# Patient Record
Sex: Male | Born: 1960
Health system: Southern US, Community
[De-identification: ages and names within clinical notes are randomized; demographics above are authoritative.]

## PROBLEM LIST (undated history)

## (undated) DIAGNOSIS — H9311 Tinnitus, right ear: Secondary | ICD-10-CM

## (undated) DIAGNOSIS — Z789 Other specified health status: Secondary | ICD-10-CM

## (undated) DIAGNOSIS — G43909 Migraine, unspecified, not intractable, without status migrainosus: Secondary | ICD-10-CM

## (undated) DIAGNOSIS — R002 Palpitations: Secondary | ICD-10-CM

## (undated) DIAGNOSIS — B009 Herpesviral infection, unspecified: Secondary | ICD-10-CM

## (undated) DIAGNOSIS — G720 Drug-induced myopathy: Secondary | ICD-10-CM

## (undated) DIAGNOSIS — G2581 Restless legs syndrome: Secondary | ICD-10-CM

## (undated) DIAGNOSIS — I10 Essential (primary) hypertension: Secondary | ICD-10-CM

## (undated) DIAGNOSIS — Z860101 Personal history of adenomatous and serrated colon polyps: Secondary | ICD-10-CM

## (undated) DIAGNOSIS — I2510 Atherosclerotic heart disease of native coronary artery without angina pectoris: Secondary | ICD-10-CM

## (undated) DIAGNOSIS — G479 Sleep disorder, unspecified: Secondary | ICD-10-CM

## (undated) DIAGNOSIS — Z87442 Personal history of urinary calculi: Secondary | ICD-10-CM

## (undated) DIAGNOSIS — E785 Hyperlipidemia, unspecified: Secondary | ICD-10-CM

## (undated) DIAGNOSIS — R079 Chest pain, unspecified: Secondary | ICD-10-CM

## (undated) DIAGNOSIS — G5600 Carpal tunnel syndrome, unspecified upper limb: Secondary | ICD-10-CM

## (undated) DIAGNOSIS — R1310 Dysphagia, unspecified: Secondary | ICD-10-CM

## (undated) DIAGNOSIS — R109 Unspecified abdominal pain: Secondary | ICD-10-CM

## (undated) DIAGNOSIS — M1990 Unspecified osteoarthritis, unspecified site: Secondary | ICD-10-CM

## (undated) DIAGNOSIS — E7800 Pure hypercholesterolemia, unspecified: Secondary | ICD-10-CM

## (undated) DIAGNOSIS — K5730 Diverticulosis of large intestine without perforation or abscess without bleeding: Secondary | ICD-10-CM

## (undated) DIAGNOSIS — K4090 Unilateral inguinal hernia, without obstruction or gangrene, not specified as recurrent: Secondary | ICD-10-CM

## (undated) HISTORY — DX: Restless legs syndrome: G25.81

## (undated) HISTORY — DX: Hemochromatosis, unspecified: E83.119

## (undated) HISTORY — DX: Carpal tunnel syndrome, unspecified upper limb: G56.00

## (undated) HISTORY — PX: LIVER BIOPSY: SHX301

## (undated) HISTORY — DX: Drug-induced myopathy: G72.0

## (undated) HISTORY — DX: Migraine, unspecified, not intractable, without status migrainosus: G43.909

## (undated) HISTORY — DX: Unspecified abdominal pain: R10.9

## (undated) HISTORY — DX: Tinnitus, right ear: H93.11

## (undated) HISTORY — DX: Herpesviral infection, unspecified: B00.9

## (undated) HISTORY — DX: Other specified health status: Z78.9

## (undated) HISTORY — DX: Sleep disorder, unspecified: G47.9

## (undated) HISTORY — DX: Atherosclerotic heart disease of native coronary artery without angina pectoris: I25.10

## (undated) HISTORY — DX: Diverticulosis of large intestine without perforation or abscess without bleeding: K57.30

## (undated) HISTORY — DX: Palpitations: R00.2

## (undated) HISTORY — DX: Personal history of adenomatous and serrated colon polyps: Z86.0101

## (undated) HISTORY — DX: Pure hypercholesterolemia, unspecified: E78.00

## (undated) HISTORY — DX: Dysphagia, unspecified: R13.10

## (undated) HISTORY — PX: COLONOSCOPY: SHX174

## (undated) HISTORY — DX: Chest pain, unspecified: R07.9

---

## 2003-11-11 ENCOUNTER — Encounter: Admission: RE | Admit: 2003-11-11 | Discharge: 2003-11-11 | Payer: Self-pay | Admitting: Family Medicine

## 2004-12-02 ENCOUNTER — Encounter: Admission: RE | Admit: 2004-12-02 | Discharge: 2004-12-02 | Payer: Self-pay | Admitting: Family Medicine

## 2006-07-26 HISTORY — PX: SHOULDER ARTHROSCOPY: SHX128

## 2008-01-30 ENCOUNTER — Encounter: Admission: RE | Admit: 2008-01-30 | Discharge: 2008-01-30 | Payer: Self-pay | Admitting: Family Medicine

## 2008-02-20 ENCOUNTER — Encounter: Admission: RE | Admit: 2008-02-20 | Discharge: 2008-02-20 | Payer: Self-pay | Admitting: Family Medicine

## 2008-11-18 ENCOUNTER — Encounter: Admission: RE | Admit: 2008-11-18 | Discharge: 2008-11-18 | Payer: Self-pay | Admitting: Family Medicine

## 2008-12-25 ENCOUNTER — Encounter: Payer: Self-pay | Admitting: Critical Care Medicine

## 2009-01-21 ENCOUNTER — Ambulatory Visit (HOSPITAL_COMMUNITY): Admission: RE | Admit: 2009-01-21 | Discharge: 2009-01-21 | Payer: Self-pay | Admitting: Cardiology

## 2009-03-07 DIAGNOSIS — E7800 Pure hypercholesterolemia, unspecified: Secondary | ICD-10-CM

## 2009-03-07 DIAGNOSIS — G2581 Restless legs syndrome: Secondary | ICD-10-CM

## 2009-03-07 DIAGNOSIS — I10 Essential (primary) hypertension: Secondary | ICD-10-CM | POA: Insufficient documentation

## 2009-03-07 DIAGNOSIS — B009 Herpesviral infection, unspecified: Secondary | ICD-10-CM | POA: Insufficient documentation

## 2009-03-10 ENCOUNTER — Ambulatory Visit: Payer: Self-pay | Admitting: Critical Care Medicine

## 2009-03-10 DIAGNOSIS — R0609 Other forms of dyspnea: Secondary | ICD-10-CM

## 2009-03-10 DIAGNOSIS — R0989 Other specified symptoms and signs involving the circulatory and respiratory systems: Secondary | ICD-10-CM

## 2011-12-21 ENCOUNTER — Ambulatory Visit
Admission: RE | Admit: 2011-12-21 | Discharge: 2011-12-21 | Disposition: A | Discharge status: Home / Self Care | Payer: BC Managed Care – PPO | Source: Ambulatory Visit | Attending: Family Medicine | Admitting: Family Medicine

## 2011-12-21 ENCOUNTER — Other Ambulatory Visit: Payer: Self-pay | Admitting: Family Medicine

## 2011-12-21 DIAGNOSIS — R51 Headache: Secondary | ICD-10-CM

## 2012-01-04 ENCOUNTER — Telehealth: Payer: Self-pay | Admitting: Hematology & Oncology

## 2012-01-04 NOTE — Telephone Encounter (Signed)
Left pt message to call for appointment °

## 2012-01-06 ENCOUNTER — Telehealth: Payer: Self-pay | Admitting: Hematology & Oncology

## 2012-01-06 NOTE — Telephone Encounter (Signed)
Left pt message to call for appointment. Pt is out of town will call 3rd time after 6-15

## 2012-01-31 ENCOUNTER — Ambulatory Visit (HOSPITAL_BASED_OUTPATIENT_CLINIC_OR_DEPARTMENT_OTHER): Payer: BC Managed Care – PPO | Admitting: Hematology & Oncology

## 2012-01-31 ENCOUNTER — Other Ambulatory Visit (HOSPITAL_BASED_OUTPATIENT_CLINIC_OR_DEPARTMENT_OTHER): Payer: BC Managed Care – PPO | Admitting: Lab

## 2012-01-31 ENCOUNTER — Ambulatory Visit: Payer: BC Managed Care – PPO

## 2012-01-31 DIAGNOSIS — E119 Type 2 diabetes mellitus without complications: Secondary | ICD-10-CM

## 2012-01-31 LAB — CBC WITH DIFFERENTIAL (CANCER CENTER ONLY)
BASO#: 0 10*3/uL (ref 0.0–0.2)
EOS%: 0.8 % (ref 0.0–7.0)
Eosinophils Absolute: 0.1 10*3/uL (ref 0.0–0.5)
HGB: 13.3 g/dL (ref 13.0–17.1)
LYMPH#: 1.4 10*3/uL (ref 0.9–3.3)
LYMPH%: 21 % (ref 14.0–48.0)
MONO%: 10.2 % (ref 0.0–13.0)
RBC: 4.33 10*6/uL (ref 4.20–5.70)

## 2012-01-31 NOTE — Progress Notes (Signed)
This office note has been dictated.

## 2012-01-31 NOTE — Progress Notes (Signed)
CC:   Bryan Lemma. Manus Gunning, M.D.  DIAGNOSIS:  Hemochromatosis.  HISTORY OF PRESENT ILLNESS:  Mr. Wolbert is a real nice 51 year old gentleman.  I actually saw him back in 2005.  At that point in time he was referred because of hemochromatosis.  However, Mr. Kipp never did any followup with this at that time.  He has been seen by Dr. Manus Gunning.  Dr. Manus Gunning has been following his iron studies and ferritin.  Over the past couple years Mr. Ines ferritin has been running fairly stable.  Back in May of 2011 ferritin was 340.  In May of 2012 ferritin was also 340.  However, in June of this year the ferritin went up to 427.  His iron studies recently showed his serum iron to be 278.  Six months ago it was 113.  Mr. Johansson has been keeping in great shape.  Since I last saw him, he has really looked good.  He is working.  He is working out quite a bit. He is on a few new medications since we last saw him.  He is not sure what type of hemochromatosis he has.  He knows that it runs in his family.  His father had it.  I think an uncle also had it. He says he thinks 1 or 2 of his brothers has it.  He has had no problems with fatigue or weakness.  He has had no abdominal pain.  His appetite has been good.  He has had no change in bowel or bladder habits.  He has had no joint aches or pains.  He has had no issues with diabetes.  There is no dysphagia or odynophagia.  He has had no cough.  He has had no rashes.  Again, Dr. Manus Gunning kindly referred him back to the [facility] so that we could get him on a phlebotomy program.  PAST MEDICAL HISTORY: 1. Hypertension. 2. Hyperlipidemia. 3. Restless legs syndrome. 4. Nephrolithiasis. 5. Herpes simplex.  ALLERGIES:  Mirapex.  MEDICATIONS: 1. Lisinopril 20 mg p.o. daily 2. Zocor 40 mg p.o. daily. 3. Acyclovir 200 mg p.o. 5 times a day for outbreak. 4. Ultram 50 mg p.o. b.i.d. p.r.n. headaches.  SOCIAL HISTORY:   Negative for tobacco use.  There is rare alcohol use. He has no occupational exposures.  He just got back from the beach.  He was not generous with wearing the sunscreen.  FAMILY HISTORY:  Remarkable for hemochromatosis in his father and a couple brothers.  REVIEW OF SYSTEMS:  As stated in history of present illness.  No additional findings are noted on a 12 system review.  PHYSICAL EXAMINATION:  General:  This is a well-developed, well- nourished white gentleman in no obvious distress.  He is quite fit and vigorous.  Vital signs:  Temperature of 97.3, pulse 58, respiratory rate 20, blood pressure 113/66.  Weight is 177.  Head and neck:  Shows a normocephalic, atraumatic skull.  There are no ocular or oral lesions. There are no palpable cervical or supraclavicular lymph nodes.  Lungs: Clear to percussion and auscultation bilaterally.  Cardiac:  Regular rate and rhythm with normal S1, S2.  There are no murmurs, rubs or bruits.  Abdomen:  Soft with good bowel sounds.  There is no palpable abdominal mass.  There is no fluid wave.  There is no guarding or rebound tenderness.  There is no palpable hepatosplenomegaly.  Back:  No tenderness over the spine, ribs or hips.  Extremities:  Show  no clubbing, cyanosis or edema.  He has good range motion of his joints. There is no joint swelling.  In particular, I do not see any swelling of the 1st metacarpophalangeal joint.  LABORATORY STUDIES:  White cell count 6.5, hemoglobin 13.3, hematocrit 37.3, platelet count 150.  IMPRESSION:  Mr. Lammert is a nice 51 year old gentleman with hemochromatosis.  I did send off a genetic assay on him.  One would have to think that he is not a homozygous carrier of the disease given that he is really not on a phlebotomy program and yet his ferritin really has not tended to trend upward.  Recently, however, it looks like his ferritin and iron have decided to make a "move up".  I told Mr. Uppal about the  importance of keeping his ferritin below 100.  This is the "state of the art" therapy for hemochromatosis.  As such, we are going to have to get Mr. Dohse on a phlebotomy program to get his ferritin down.  We will have to be flexible and work with Mr. Krempasky schedule.  I think this would make it a lot easier for him.  He does have 3 kids.  He has 2 daughters and a son.  His daughters do not really need to be checked right now.  If they had hemochromatosis, I could not imagine them having iron accumulation because of monthly cycles.  His son, who is 57, might need to be checked at some point in the next few years to see if he does carry the mutation.  If so, then iron studies could be done to assess for his iron retention.  It has been a year since I last saw Mr. Javid.  Again, he is looking real good.  He is definitely staying fit.  He says he made a conscientious effort to change his lifestyle, to exercise more, to eat better and to lose weight.  I will get Mr. Eggenberger on a phlebotomy program.  Will probably get him phlebotomized weekly for about 3 or 4 weeks.  Will then repeat his ferritin and see how that looks.  We will get Mr. Baumgarten back to see [place] probably in about 6 weeks or so, at which point we will be able to reassess his iron stores.  I spent a good hour or so with Mr. Neumeister today.  He obviously is very knowledgeable about the disease.  I did talk to him at length and encouraged him to be proactive with phlebotomies.    ______________________________ Josph Macho, M.D. PRE/MEDQ  D:  01/31/2012  T:  01/31/2012  Job:  [id number]  ADDENDUM:  HOMOZYGOUS for C282Y mutation.

## 2012-02-02 ENCOUNTER — Other Ambulatory Visit: Payer: Self-pay | Admitting: *Deleted

## 2012-02-02 NOTE — Progress Notes (Signed)
Pt called stating he needed to set up a phlebotomy and wanted to know if his labs were back yet. Will ask Raiford Noble to schedule this Friday at 4:00 and next Friday at 3:30. Will schedule further appts at a later date. To have 3 - 4 weekly phlebotomies per Dr Myna Hidalgo. DNA test for hemochromatosis not back as of yet. Explained that it may take up to 2 weeks. Pt verbalized understanding. Orders placed under

## 2012-02-04 ENCOUNTER — Other Ambulatory Visit: Payer: Self-pay

## 2012-02-04 ENCOUNTER — Ambulatory Visit: Payer: BC Managed Care – PPO

## 2012-02-04 NOTE — Progress Notes (Unsigned)
4:51 PM 02/04/2012 Pt rescheduled d/t arriving late. Teola Bradley, Jada Kuhnert [employer]

## 2012-02-07 LAB — COMPREHENSIVE METABOLIC PANEL
ALT: 13 U/L (ref 0–53)
Albumin: 4.1 g/dL (ref 3.5–5.2)
Alkaline Phosphatase: 62 U/L (ref 39–117)
Glucose, Bld: 92 mg/dL (ref 70–99)
Sodium: 139 mEq/L (ref 135–145)
Total Bilirubin: 0.5 mg/dL (ref 0.3–1.2)
Total Protein: 6 g/dL (ref 6.0–8.3)

## 2012-02-07 LAB — IRON AND TIBC
%SAT: 62 % — ABNORMAL HIGH (ref 20–55)
Iron: 141 ug/dL (ref 42–165)

## 2012-02-11 ENCOUNTER — Ambulatory Visit (HOSPITAL_BASED_OUTPATIENT_CLINIC_OR_DEPARTMENT_OTHER): Payer: BC Managed Care – PPO

## 2012-02-11 NOTE — Patient Instructions (Signed)

## 2012-02-18 ENCOUNTER — Ambulatory Visit (HOSPITAL_BASED_OUTPATIENT_CLINIC_OR_DEPARTMENT_OTHER): Payer: BC Managed Care – PPO

## 2012-02-18 NOTE — Progress Notes (Signed)
Bard Herbert presents today for phlebotomy per MD orders. Phlebotomy procedure started at 1115 and ended at 1135. Approximately 500 mls removed. Patient observed for 30 minutes after procedure without any incident. Patient tolerated procedure well. IV needle removed intact.

## 2012-02-18 NOTE — Patient Instructions (Signed)

## 2012-03-01 ENCOUNTER — Ambulatory Visit (HOSPITAL_BASED_OUTPATIENT_CLINIC_OR_DEPARTMENT_OTHER): Payer: BC Managed Care – PPO

## 2012-03-01 VITALS — BP 120/68 | HR 63 | Temp 97.3°F | Resp 16

## 2012-03-01 DIAGNOSIS — R0989 Other specified symptoms and signs involving the circulatory and respiratory systems: Secondary | ICD-10-CM

## 2012-03-01 NOTE — Progress Notes (Signed)
Brent Odom presents today for phlebotomy per MD orders. Phlebotomy procedure started [id number] and ended at 1210. 500 ml removed. Patient observed for 30 minutes after procedure without any incident. Patient tolerated procedure well. IV needle removed intact.

## 2012-03-01 NOTE — Patient Instructions (Signed)

## 2012-12-13 ENCOUNTER — Telehealth: Payer: Self-pay | Admitting: Hematology & Oncology

## 2012-12-13 NOTE — Telephone Encounter (Signed)
Received call from PCP said pt needs to be seen. They will fax current lab and note to show to Dr. Myna Hidalgo. Pt aware of 5-28 appointment

## 2012-12-13 NOTE — Telephone Encounter (Signed)
Dr. Myna Hidalgo has seen lab and note from referring and said 5-28 is fine

## 2012-12-19 ENCOUNTER — Other Ambulatory Visit: Payer: Self-pay | Admitting: Medical

## 2012-12-20 ENCOUNTER — Ambulatory Visit (HOSPITAL_BASED_OUTPATIENT_CLINIC_OR_DEPARTMENT_OTHER): Payer: BC Managed Care – PPO | Admitting: Hematology & Oncology

## 2012-12-20 ENCOUNTER — Other Ambulatory Visit (HOSPITAL_BASED_OUTPATIENT_CLINIC_OR_DEPARTMENT_OTHER): Payer: BC Managed Care – PPO | Admitting: Lab

## 2012-12-20 LAB — CBC WITH DIFFERENTIAL (CANCER CENTER ONLY)
BASO%: 0.2 % (ref 0.0–2.0)
Eosinophils Absolute: 0.1 10*3/uL (ref 0.0–0.5)
LYMPH%: 14 % (ref 14.0–48.0)
MCH: 30.9 pg (ref 28.0–33.4)
MCV: 89 fL (ref 82–98)
MONO%: 8.1 % (ref 0.0–13.0)
Platelets: 163 10*3/uL (ref 145–400)
RDW: 12.6 % (ref 11.1–15.7)

## 2012-12-20 LAB — COMPREHENSIVE METABOLIC PANEL
Alkaline Phosphatase: 59 U/L (ref 39–117)
Glucose, Bld: 123 mg/dL — ABNORMAL HIGH (ref 70–99)
Sodium: 139 mEq/L (ref 135–145)
Total Bilirubin: 0.5 mg/dL (ref 0.3–1.2)
Total Protein: 6.5 g/dL (ref 6.0–8.3)

## 2012-12-20 LAB — IRON AND TIBC
TIBC: 254 ug/dL (ref 215–435)
UIBC: 108 ug/dL — ABNORMAL LOW (ref 125–400)

## 2012-12-20 NOTE — Progress Notes (Signed)
This office note has been dictated.

## 2012-12-21 NOTE — Progress Notes (Signed)
CC:   Brent Odom. Brent Odom, M.D.  DIAGNOSIS:  Hemochromatosis (C282Y mutation-homozygous).  CURRENT THERAPY:  Phlebotomy to maintain ferritin less than 100.  INTERIM HISTORY:  Brent Odom comes in for follow-up.  We last saw him back in July.  When we first saw him, his ferritin was 528.  He was phlebotomized 3 times.  Unfortunately, he could not come back for further follow-up.  He has  changed jobs.  He is now with a new job.  His daughter is getting married in July.  He is getting ready for this.  He sees Dr. Manus Odom.  Dr. Manus Odom did some blood work on him.  Dr. Manus Odom found that his ferritin was over 300.  As such, Brent Odom is back here for follow-up.  Brent Odom feels well.  He does feel a little bit fatigued.  He did have some "colds" over the wintertime.  He did take vitamin C for this. He knows that vitamin C will increase his iron absorption.  He has had no abdominal pain.  There has been no change in bowel or bladder habits.  He has had no cough or shortness of breath.  There has been no leg swelling.  PHYSICAL EXAMINATION:  General:  This is a well-developed, well- nourished white gentleman in no obvious distress.  Vital signs: Temperature 98.0, pulse 70, respiratory rate 18, blood pressure 112/63, weight 176.  Head/Neck:  Normocephalic, atraumatic skull.  There are no ocular or oral lesions.  There are no palpable cervical or supraclavicular lymph nodes.  Lungs:  Clear bilaterally.  Cardiac: Regular rate and rhythm with a normal S1 and S2.  There are no murmurs, rubs or bruits.  Abdomen:  Soft with good bowel sounds.  There is no palpable abdominal mass.  There is no fluid wave.  There is no palpable hepatosplenomegaly.  Extremities:  Show no clubbing, cyanosis or edema. Neurologic:  Shows no focal neurological deficits.  Skin:  No rashes, ecchymoses, or petechia.  LABORATORY STUDIES:  White cell count 6.1, hemoglobin 14.2, hematocrit 40.9, platelet count  163.  IMPRESSION/PLAN:  Brent Odom is a 52 year old gentleman with hemochromatosis.  Again, his homozygous is a major mutation.  We will go ahead and try to get him set up with another run of phlebotomies.  He will probably need 3 or 4 more.  Apparently, he is going to try to rearrange his work schedule for [place] and he will let us know we he can be phlebotomized.  I want to see him back in 2 months' time.  We will have to try to be a little bit more vigilant with his iron monitoring.    ______________________________ Josph Macho, M.D. PRE/MEDQ  D:  12/20/2012  T:  12/21/2012  Job:  [id number]

## 2013-01-23 ENCOUNTER — Ambulatory Visit (HOSPITAL_BASED_OUTPATIENT_CLINIC_OR_DEPARTMENT_OTHER): Payer: BC Managed Care – PPO

## 2013-01-23 NOTE — Patient Instructions (Addendum)

## 2013-01-23 NOTE — Progress Notes (Signed)
Bard Herbert presents today for phlebotomy per MD orders. Phlebotomy procedure started at 0930and ended at 0935. Approximately 500 mls removed. Patient observed for 30 minutes after procedure without any incident. Patient tolerated procedure well. IV needle removed intact.

## 2013-02-21 ENCOUNTER — Other Ambulatory Visit (HOSPITAL_BASED_OUTPATIENT_CLINIC_OR_DEPARTMENT_OTHER): Payer: BC Managed Care – PPO | Admitting: Lab

## 2013-02-21 ENCOUNTER — Ambulatory Visit: Payer: BC Managed Care – PPO

## 2013-02-21 ENCOUNTER — Ambulatory Visit (HOSPITAL_BASED_OUTPATIENT_CLINIC_OR_DEPARTMENT_OTHER): Payer: BC Managed Care – PPO | Admitting: Hematology & Oncology

## 2013-02-21 LAB — CBC WITH DIFFERENTIAL (CANCER CENTER ONLY)
BASO#: 0 10*3/uL (ref 0.0–0.2)
Eosinophils Absolute: 0.1 10*3/uL (ref 0.0–0.5)
HCT: 42.3 % (ref 38.7–49.9)
LYMPH%: 21.8 % (ref 14.0–48.0)
MCH: 31.3 pg (ref 28.0–33.4)
MCV: 91 fL (ref 82–98)
MONO%: 8.7 % (ref 0.0–13.0)
NEUT%: 68.1 % (ref 40.0–80.0)
Platelets: 159 10*3/uL (ref 145–400)
RBC: 4.64 10*6/uL (ref 4.20–5.70)

## 2013-02-21 LAB — IRON AND TIBC CHCC
%SAT: 40 % (ref 20–55)
TIBC: 274 ug/dL (ref 202–409)

## 2013-02-21 NOTE — Progress Notes (Signed)
This office note has been dictated.

## 2013-02-21 NOTE — Progress Notes (Signed)
No phlebotomy per dr. ennever 

## 2013-02-22 ENCOUNTER — Telehealth: Payer: Self-pay | Admitting: Hematology & Oncology

## 2013-02-22 NOTE — Telephone Encounter (Signed)
Pt aware of 8-4,12 and 19th phlebotomies and 9-11 MD appointments

## 2013-02-22 NOTE — Progress Notes (Signed)
CC:   Brent Odom. Brent Odom, M.D.  DIAGNOSIS:  Hemochromatosis (C282Y mutation, homozygous).  CURRENT THERAPY:  Phlebotomy to maintain ferritin less than 100.  INTERIM HISTORY:  Brent Odom comes in for a followup.  We last phlebotomized him, I think, back in May.  Since then, he has had some family issues.  Most of this was his daughter's wedding.  She got married back on July 12th.  This was a good time for his family.  He is quite pleased with how well things went.  He is working.  He is doing well.  He is staying busy.  He really has no symptoms.  He is trying to exercise.  He is working out.  He is having no problems with cough or shortness breath.  He has had no fevers, sweats, or chills.  He has had no nausea or vomiting. There has been no change in bowel or bladder habits.  PHYSICAL EXAM:  General:  This is a well-developed, well-nourished white gentleman in no obvious distress.  Vital Signs:  Show a temperature of 97.8, pulse 74, respiratory rate 18, blood pressure 99/62.  Weight is 178.  Head and Neck:  Show a normocephalic, atraumatic skull.  There are no ocular or oral lesions.  There are no palpable cervical or supraclavicular lymph nodes.  Lungs:  Clear bilaterally.  Cardiac: Regular rate and rhythm with a normal S1 and S2.  There are no murmurs, rubs, or bruits.  Abdomen:  Soft.  He has good bowel sounds.  There is no fluid wave.  There is no palpable hepatosplenomegaly.  Extremities: Show no clubbing, cyanosis, or edema.  He has good range motion of his joints.  There is no joint swelling.  Neurological:  Shows no focal neurological deficits.  LABORATORY STUDIES:  White cell count is 6.1.  Hemoglobin of 14.5, hematocrit of 42.3, platelet count 159.  Ferritin is 222.  Iron saturation is 40%.  IMPRESSION:  Brent Odom is a nice 52 year old gentleman with hemochromatosis.  We are going to have to phlebotomize him.  I think he is probably going to need 3 weeks of  phlebotomies.  His iron saturation is starting to come down which is nice to see.  I do want to get his ferritin below 100.  We will try to get him set up with a phlebotomy next week.  I will plan to see him back myself, probably in about 6 weeks or so, at which point we should be seeing a nice decrease in his ferritin and iron saturation.   ______________________________ Josph Macho, M.D. PRE/MEDQ  D:  02/21/2013  T:  02/22/2013  Job:  [id number]

## 2013-02-26 ENCOUNTER — Ambulatory Visit: Payer: BC Managed Care – PPO

## 2013-02-26 NOTE — Progress Notes (Signed)
Bard Herbert presents today for phlebotomy per MD orders. Phlebotomy procedure started at 0835 and ended at 0840 500 grams removed. Patient observed for 30 minutes after procedure without any incident. Patient tolerated procedure well. IV needle removed intact.

## 2013-02-26 NOTE — Patient Instructions (Signed)
Therapeutic Phlebotomy Therapeutic phlebotomy is the controlled removal of blood from your body for the purpose of treating a medical condition. It is similar to donating blood. Usually, about a pint (470 mL) of blood is removed. The average adult has 9 to 12 pints (4.3 to 5.7 L) of blood. Therapeutic phlebotomy may be used to treat the following medical conditions:  Hemochromatosis. This is a condition in which there is too much iron in the blood.  Polycythemia vera. This is a condition in which there are too many red cells in the blood.  Porphyria cutanea tarda. This is a disease usually passed from one generation to the next (inherited). It is a condition in which an important part of hemoglobin is not made properly. This results in the build up of abnormal amounts of porphyrins in the body.  Sickle cell disease. This is an inherited disease. It is a condition in which the red blood cells form an abnormal crescent shape rather than a round shape. LET YOUR CAREGIVER KNOW ABOUT:  Allergies.  Medicines taken including herbs, eyedrops, over-the-counter medicines, and creams.  Use of steroids (by mouth or creams).  Previous problems with anesthetics or numbing medicine.  History of blood clots.  History of bleeding or blood problems.  Previous surgery.  Possibility of pregnancy, if this applies. RISKS AND COMPLICATIONS This is a simple and safe procedure. Problems are unlikely. However, problems can occur and may include:  Nausea or lightheadedness.  Low blood pressure.  Soreness, bleeding, swelling, or bruising at the needle insertion site.  Infection. BEFORE THE PROCEDURE  This is a procedure that can be done as an outpatient. Confirm the time that you need to arrive for your procedure. Confirm whether there is a need to fast or withhold any medications. It is helpful to wear clothing with sleeves that can be raised above the elbow. A blood sample may be done to determine the  amount of red blood cells or iron in your blood. Plan ahead of time to have someone drive you home after the procedure. PROCEDURE The entire procedure from preparation through recovery takes about 1 hour. The actual collection takes about 10 to 15 minutes.  A needle will be inserted into your vein.  Tubing and a collection bag will be attached to that needle.  Blood will flow through the needle and tubing into the collection bag.  You may be asked to open and close your hand slowly and continuously during the entire collection.  Once the specified amount of blood has been removed from your body, the collection bag and tubing will be clamped.  The needle will be removed.  Pressure will be held on the site of the needle insertion to stop the bleeding. Then a bandage will be placed over the needle insertion site. AFTER THE PROCEDURE  Your recovery will be assessed and monitored. If there are no problems, as an outpatient, you should be able to go home shortly after the procedure.  Document Released: 12/14/2010 Document Revised: 10/04/2011 Document Reviewed: 12/14/2010 Exit[employer] Patient Information 2014 [employer].  

## 2013-03-06 ENCOUNTER — Ambulatory Visit (HOSPITAL_BASED_OUTPATIENT_CLINIC_OR_DEPARTMENT_OTHER): Payer: BC Managed Care – PPO

## 2013-03-06 NOTE — Progress Notes (Signed)
Brent Odom presents today for phlebotomy per MD orders. Phlebotomy procedure started at 0850 and ended at 0856. 500 grams removed. Patient observed for 30 minutes after procedure without any incident. Patient tolerated procedure well. IV needle removed intact.

## 2013-03-06 NOTE — Patient Instructions (Signed)
Therapeutic Phlebotomy Therapeutic phlebotomy is the controlled removal of blood from your body for the purpose of treating a medical condition. It is similar to donating blood. Usually, about a pint (470 mL) of blood is removed. The average adult has 9 to 12 pints (4.3 to 5.7 L) of blood. Therapeutic phlebotomy may be used to treat the following medical conditions:  Hemochromatosis. This is a condition in which there is too much iron in the blood.  Polycythemia vera. This is a condition in which there are too many red cells in the blood.  Porphyria cutanea tarda. This is a disease usually passed from one generation to the next (inherited). It is a condition in which an important part of hemoglobin is not made properly. This results in the build up of abnormal amounts of porphyrins in the body.  Sickle cell disease. This is an inherited disease. It is a condition in which the red blood cells form an abnormal crescent shape rather than a round shape. LET YOUR CAREGIVER KNOW ABOUT:  Allergies.  Medicines taken including herbs, eyedrops, over-the-counter medicines, and creams.  Use of steroids (by mouth or creams).  Previous problems with anesthetics or numbing medicine.  History of blood clots.  History of bleeding or blood problems.  Previous surgery.  Possibility of pregnancy, if this applies. RISKS AND COMPLICATIONS This is a simple and safe procedure. Problems are unlikely. However, problems can occur and may include:  Nausea or lightheadedness.  Low blood pressure.  Soreness, bleeding, swelling, or bruising at the needle insertion site.  Infection. BEFORE THE PROCEDURE  This is a procedure that can be done as an outpatient. Confirm the time that you need to arrive for your procedure. Confirm whether there is a need to fast or withhold any medications. It is helpful to wear clothing with sleeves that can be raised above the elbow. A blood sample may be done to determine the  amount of red blood cells or iron in your blood. Plan ahead of time to have someone drive you home after the procedure. PROCEDURE The entire procedure from preparation through recovery takes about 1 hour. The actual collection takes about 10 to 15 minutes.  A needle will be inserted into your vein.  Tubing and a collection bag will be attached to that needle.  Blood will flow through the needle and tubing into the collection bag.  You may be asked to open and close your hand slowly and continuously during the entire collection.  Once the specified amount of blood has been removed from your body, the collection bag and tubing will be clamped.  The needle will be removed.  Pressure will be held on the site of the needle insertion to stop the bleeding. Then a bandage will be placed over the needle insertion site. AFTER THE PROCEDURE  Your recovery will be assessed and monitored. If there are no problems, as an outpatient, you should be able to go home shortly after the procedure.  Document Released: 12/14/2010 Document Revised: 10/04/2011 Document Reviewed: 12/14/2010 Exit[employer] Patient Information 2014 [employer].  

## 2013-03-13 ENCOUNTER — Ambulatory Visit (HOSPITAL_BASED_OUTPATIENT_CLINIC_OR_DEPARTMENT_OTHER): Payer: BC Managed Care – PPO

## 2013-03-13 NOTE — Patient Instructions (Addendum)
Therapeutic Phlebotomy Therapeutic phlebotomy is the controlled removal of blood from your body for the purpose of treating a medical condition. It is similar to donating blood. Usually, about a pint (470 mL) of blood is removed. The average adult has 9 to 12 pints (4.3 to 5.7 L) of blood. Therapeutic phlebotomy may be used to treat the following medical conditions:  Hemochromatosis. This is a condition in which there is too much iron in the blood.  Polycythemia vera. This is a condition in which there are too many red cells in the blood.  Porphyria cutanea tarda. This is a disease usually passed from one generation to the next (inherited). It is a condition in which an important part of hemoglobin is not made properly. This results in the build up of abnormal amounts of porphyrins in the body.  Sickle cell disease. This is an inherited disease. It is a condition in which the red blood cells form an abnormal crescent shape rather than a round shape. LET YOUR CAREGIVER KNOW ABOUT:  Allergies.  Medicines taken including herbs, eyedrops, over-the-counter medicines, and creams.  Use of steroids (by mouth or creams).  Previous problems with anesthetics or numbing medicine.  History of blood clots.  History of bleeding or blood problems.  Previous surgery.  Possibility of pregnancy, if this applies. RISKS AND COMPLICATIONS This is a simple and safe procedure. Problems are unlikely. However, problems can occur and may include:  Nausea or lightheadedness.  Low blood pressure.  Soreness, bleeding, swelling, or bruising at the needle insertion site.  Infection. BEFORE THE PROCEDURE  This is a procedure that can be done as an outpatient. Confirm the time that you need to arrive for your procedure. Confirm whether there is a need to fast or withhold any medications. It is helpful to wear clothing with sleeves that can be raised above the elbow. A blood sample may be done to determine the  amount of red blood cells or iron in your blood. Plan ahead of time to have someone drive you home after the procedure. PROCEDURE The entire procedure from preparation through recovery takes about 1 hour. The actual collection takes about 10 to 15 minutes.  A needle will be inserted into your vein.  Tubing and a collection bag will be attached to that needle.  Blood will flow through the needle and tubing into the collection bag.  You may be asked to open and close your hand slowly and continuously during the entire collection.  Once the specified amount of blood has been removed from your body, the collection bag and tubing will be clamped.  The needle will be removed.  Pressure will be held on the site of the needle insertion to stop the bleeding. Then a bandage will be placed over the needle insertion site. AFTER THE PROCEDURE  Your recovery will be assessed and monitored. If there are no problems, as an outpatient, you should be able to go home shortly after the procedure.  Document Released: 12/14/2010 Document Revised: 10/04/2011 Document Reviewed: 12/14/2010 [employer] Patient Information 2014 [employer].  

## 2013-03-13 NOTE — Progress Notes (Signed)
Brent Odom presents today for phlebotomy per MD orders. Phlebotomy procedure started at 0825 and ended at 0835. 500 grams removed. Patient observed for 30 minutes after procedure without any incident. Patient tolerated procedure well. IV needle removed intact.

## 2013-04-05 ENCOUNTER — Other Ambulatory Visit (HOSPITAL_BASED_OUTPATIENT_CLINIC_OR_DEPARTMENT_OTHER): Payer: BC Managed Care – PPO | Admitting: Lab

## 2013-04-05 ENCOUNTER — Ambulatory Visit (HOSPITAL_BASED_OUTPATIENT_CLINIC_OR_DEPARTMENT_OTHER): Payer: BC Managed Care – PPO | Admitting: Hematology & Oncology

## 2013-04-05 LAB — CBC WITH DIFFERENTIAL (CANCER CENTER ONLY)
Eosinophils Absolute: 0.1 10*3/uL (ref 0.0–0.5)
HCT: 41.3 % (ref 38.7–49.9)
LYMPH#: 1.1 10*3/uL (ref 0.9–3.3)
LYMPH%: 19.9 % (ref 14.0–48.0)
MCV: 93 fL (ref 82–98)
MONO#: 0.5 10*3/uL (ref 0.1–0.9)
Platelets: 163 10*3/uL (ref 145–400)
RBC: 4.43 10*6/uL (ref 4.20–5.70)
WBC: 5.4 10*3/uL (ref 4.0–10.0)

## 2013-04-05 LAB — IRON AND TIBC CHCC
%SAT: 35 % (ref 20–55)
TIBC: 285 ug/dL (ref 202–409)
UIBC: 186 ug/dL (ref 117–376)

## 2013-04-05 LAB — FERRITIN CHCC: Ferritin: 90 ng/ml (ref 22–316)

## 2013-04-05 NOTE — Progress Notes (Signed)
This office note has been dictated.

## 2013-04-06 ENCOUNTER — Telehealth: Payer: Self-pay | Admitting: Hematology & Oncology

## 2013-04-06 NOTE — Progress Notes (Signed)
CC:   Brent Odom. Brent Odom, M.D.  DIAGNOSIS:  Hemochromatosis (C282Y homozygous mutation).  CURRENT THERAPY:  Phlebotomy to maintain ferritin less than 100.  INTERIM HISTORY:  Brent Odom comes in for followup.  He is doing okay. He was phlebotomized back in, I think, July and early August.  He did well with this.  At that time, his ferritin was 222.  He has had no fatigue or weakness.  He has had no fevers, sweats, or chills.  He has had no nausea or vomiting.  He has had no leg rashes. There has been no leg swelling.  There has been no change in bowel or bladder habits.  He has had no headache.  There has been no double vision.  He has had no dysphagia or odynophagia.  PHYSICAL EXAMINATION:  General:  This is a well-developed, well- nourished white gentleman in no obvious distress.  Vital Signs:  Show temperature of 98, pulse 81, respiratory rate 18, blood pressure 119/72. Weight is 183.  Head and Neck:  Shows a normocephalic, atraumatic skull. There are no ocular or oral lesions.  There are no palpable cervical or supraclavicular lymph nodes.  Lungs:  Clear bilaterally.  Cardiac: Regular rate and rhythm with a normal S1 and S2.  There are no murmurs, rubs or bruits.  Abdomen:  Soft.  He has good bowel sounds.  There is no fluid wave.  There is no palpable abdominal mass.  There is no palpable hepatosplenomegaly.  Extremities:  Show no clubbing, cyanosis or edema. He has good range motion of his joints.  There is no joint swelling.  He has good muscle strength.  Neurological:  No focal neurological deficits.  Skin:  No rashes, ecchymosis, or petechia.  LABORATORY STUDIES:  White cell count is 5.4, hemoglobin 14.1, hematocrit 41.3, platelet count 163.  Ferritin is 90 with an iron saturation of 35%.  IMPRESSION:  Brent Odom is a 52 year old gentleman with hemochromatosis.  We now have him below 100 with his ferritin.  I think we can probably get him back now in about 6 weeks.  I  think this would be reasonable for followup with him.  Hopefully, we will continue to see his iron/ferritin drop.  I do not see any complications from the hemochromatosis to date.  His iron saturation is coming down.  Also, this is a good indicator of depleting iron stores.   ______________________________ Josph Macho, M.D. PRE/MEDQ  D:  04/05/2013  T:  04/06/2013  Job:  [id number]

## 2013-04-06 NOTE — Telephone Encounter (Signed)
Pt aware of 1-16 appointment °

## 2013-05-31 ENCOUNTER — Ambulatory Visit: Payer: BC Managed Care – PPO | Admitting: Hematology & Oncology

## 2013-05-31 ENCOUNTER — Other Ambulatory Visit: Payer: BC Managed Care – PPO | Admitting: Lab

## 2013-05-31 ENCOUNTER — Telehealth: Payer: Self-pay | Admitting: Hematology & Oncology

## 2013-05-31 NOTE — Telephone Encounter (Signed)
Patient called and cx 05/31/13 apt and resch for 07/03/13.  Rn was notified of the cx apt

## 2013-07-03 ENCOUNTER — Ambulatory Visit: Payer: BC Managed Care – PPO | Admitting: Hematology & Oncology

## 2013-07-03 ENCOUNTER — Other Ambulatory Visit: Payer: BC Managed Care – PPO | Admitting: Lab

## 2013-07-04 ENCOUNTER — Telehealth: Payer: Self-pay | Admitting: Hematology & Oncology

## 2013-07-04 NOTE — Telephone Encounter (Signed)
Left pt message to call if he wants to r/s missed 12-9 appointment

## 2014-06-09 ENCOUNTER — Encounter: Payer: Self-pay | Admitting: *Deleted

## 2014-12-12 ENCOUNTER — Encounter (HOSPITAL_COMMUNITY): Payer: Self-pay | Admitting: Emergency Medicine

## 2014-12-12 ENCOUNTER — Emergency Department (HOSPITAL_COMMUNITY)
Admission: EM | Admit: 2014-12-12 | Discharge: 2014-12-12 | Disposition: A | Discharge status: Home / Self Care | Payer: Self-pay | Attending: Emergency Medicine | Admitting: Emergency Medicine

## 2014-12-12 ENCOUNTER — Emergency Department (HOSPITAL_COMMUNITY): Payer: Self-pay

## 2014-12-12 DIAGNOSIS — Z79899 Other long term (current) drug therapy: Secondary | ICD-10-CM | POA: Insufficient documentation

## 2014-12-12 DIAGNOSIS — I10 Essential (primary) hypertension: Secondary | ICD-10-CM | POA: Insufficient documentation

## 2014-12-12 DIAGNOSIS — N200 Calculus of kidney: Secondary | ICD-10-CM | POA: Insufficient documentation

## 2014-12-12 DIAGNOSIS — R7989 Other specified abnormal findings of blood chemistry: Secondary | ICD-10-CM | POA: Insufficient documentation

## 2014-12-12 HISTORY — DX: Essential (primary) hypertension: I10

## 2014-12-12 LAB — COMPREHENSIVE METABOLIC PANEL
ALBUMIN: 3.7 g/dL (ref 3.5–5.0)
ALK PHOS: 55 U/L (ref 38–126)
ALT: 17 U/L (ref 17–63)
ANION GAP: 13 (ref 5–15)
AST: 19 U/L (ref 15–41)
BILIRUBIN TOTAL: 1.2 mg/dL (ref 0.3–1.2)
BUN: 16 mg/dL (ref 6–20)
CHLORIDE: 102 mmol/L (ref 101–111)
CO2: 20 mmol/L — AB (ref 22–32)
Calcium: 9.2 mg/dL (ref 8.9–10.3)
Creatinine, Ser: 2.21 mg/dL — ABNORMAL HIGH (ref 0.61–1.24)
GFR calc Af Amer: 37 mL/min — ABNORMAL LOW (ref >60)
GFR, EST NON AFRICAN AMERICAN: 32 mL/min — AB (ref >60)
GLUCOSE: 98 mg/dL (ref 65–99)
POTASSIUM: 3.7 mmol/L (ref 3.5–5.1)
Sodium: 135 mmol/L (ref 135–145)
Total Protein: 6.7 g/dL (ref 6.5–8.1)

## 2014-12-12 LAB — CBC WITH DIFFERENTIAL/PLATELET
BASOS ABS: 0 10*3/uL (ref 0.0–0.1)
BASOS PCT: 0 % (ref 0–1)
EOS ABS: 0.1 10*3/uL (ref 0.0–0.7)
EOS PCT: 0 % (ref 0–5)
HCT: 43 % (ref 39.0–52.0)
Hemoglobin: 15.3 g/dL (ref 13.0–17.0)
Lymphocytes Relative: 10 % — ABNORMAL LOW (ref 12–46)
Lymphs Abs: 1.3 10*3/uL (ref 0.7–4.0)
MCH: 30.2 pg (ref 26.0–34.0)
MCHC: 35.6 g/dL (ref 30.0–36.0)
MCV: 85 fL (ref 78.0–100.0)
Monocytes Absolute: 1.1 10*3/uL — ABNORMAL HIGH (ref 0.1–1.0)
Monocytes Relative: 9 % (ref 3–12)
Neutro Abs: 10.2 10*3/uL — ABNORMAL HIGH (ref 1.7–7.7)
Neutrophils Relative %: 81 % — ABNORMAL HIGH (ref 43–77)
PLATELETS: 170 10*3/uL (ref 150–400)
RBC: 5.06 MIL/uL (ref 4.22–5.81)
RDW: 12.6 % (ref 11.5–15.5)
WBC: 12.7 10*3/uL — ABNORMAL HIGH (ref 4.0–10.5)

## 2014-12-12 LAB — URINALYSIS, ROUTINE W REFLEX MICROSCOPIC
GLUCOSE, UA: NEGATIVE mg/dL
Ketones, ur: >80 mg/dL — AB
Leukocytes, UA: NEGATIVE
Nitrite: NEGATIVE
PROTEIN: 30 mg/dL — AB
SPECIFIC GRAVITY, URINE: 1.021 (ref 1.005–1.030)
UROBILINOGEN UA: 1 mg/dL (ref 0.0–1.0)
pH: 5.5 (ref 5.0–8.0)

## 2014-12-12 LAB — URINE MICROSCOPIC-ADD ON

## 2014-12-12 MED ORDER — TAMSULOSIN HCL 0.4 MG PO CAPS
0.4000 mg | ORAL_CAPSULE | Freq: Once | ORAL | Status: AC
  Administered 2014-12-12: 0.4 mg via ORAL
  Filled 2014-12-12: qty 1

## 2014-12-12 MED ORDER — SODIUM CHLORIDE 0.9 % IV BOLUS (SEPSIS)
1000.0000 mL | Freq: Once | INTRAVENOUS | Status: AC
  Administered 2014-12-12: 1000 mL via INTRAVENOUS

## 2014-12-12 MED ORDER — ONDANSETRON HCL 4 MG/2ML IJ SOLN
4.0000 mg | Freq: Once | INTRAMUSCULAR | Status: AC
  Administered 2014-12-12: 4 mg via INTRAVENOUS
  Filled 2014-12-12: qty 2

## 2014-12-12 MED ORDER — HYDROMORPHONE HCL 1 MG/ML IJ SOLN
0.5000 mg | Freq: Once | INTRAMUSCULAR | Status: AC
  Administered 2014-12-12: 0.5 mg via INTRAVENOUS
  Filled 2014-12-12: qty 1

## 2014-12-12 MED ORDER — TAMSULOSIN HCL 0.4 MG PO CAPS: 0.4000 mg | ORAL_CAPSULE | Freq: Every day | ORAL | Status: DC

## 2014-12-12 MED ORDER — OXYCODONE-ACETAMINOPHEN 5-325 MG PO TABS: 1.0000 | ORAL_TABLET | ORAL | Status: DC | PRN

## 2014-12-12 MED ORDER — ONDANSETRON 8 MG PO TBDP: 8.0000 mg | ORAL_TABLET | Freq: Three times a day (TID) | ORAL | Status: DC | PRN

## 2014-12-12 MED ORDER — KETOROLAC TROMETHAMINE 30 MG/ML IJ SOLN
30.0000 mg | Freq: Once | INTRAMUSCULAR | Status: AC
  Administered 2014-12-12: 30 mg via INTRAVENOUS
  Filled 2014-12-12: qty 1

## 2014-12-12 NOTE — ED Notes (Signed)
Left sided kidney pain. Has px meds and took phenergan at 0200 this morning. Took percocet at 0300. Does not feel like it's moving; dribbling urine.

## 2014-12-12 NOTE — ED Provider Notes (Signed)
CSN: [id number]     Arrival date & time 12/12/14  1258 History   First MD Initiated Contact with Patient 12/12/14 1337     Chief Complaint  Patient presents with  . Nephrolithiasis     (Consider location/radiation/quality/duration/timing/severity/associated sxs/prior Treatment) HPI Brent Odom is a 54 y.o. male with history of hypertension and kidney stones, presents to emergency department complaining of right flank pain and right groin pain. Patient states symptoms started 4 days ago. States he has a stone almost every 2 months. He states normally he is able to pass it on his own. He has not seen a urologist in multiple years. He states this time he called his primary care doctor who called in him some hydrocodone and Phenergan for his symptoms. Patient states that he has never gone this long without passing a stone so he decided to come to emergency department. Patient states the hydrocodone does help his pain but never takes it away. Patient admits to nausea or vomiting. He states he is unable to urinate well, states has dribbling of the urine, clots. He denies any fever or chills. He denies any other complaints.  Past Medical History  Diagnosis Date  . Hypertension    No past surgical history on file. No family history on file. History  Substance Use Topics  . Smoking status: Not on file  . Smokeless tobacco: Not on file  . Alcohol Use: Not on file    Review of Systems  Constitutional: Negative for fever and chills.  Respiratory: Negative for cough, chest tightness and shortness of breath.   Cardiovascular: Negative for chest pain, palpitations and leg swelling.  Gastrointestinal: Negative for nausea, vomiting, abdominal pain, diarrhea and abdominal distention.  Genitourinary: Positive for dysuria, flank pain and difficulty urinating. Negative for urgency, frequency and hematuria.  Musculoskeletal: Negative for myalgias, arthralgias, neck pain and neck stiffness.  Skin:  Negative for rash.  Allergic/Immunologic: Negative for immunocompromised state.  Neurological: Negative for dizziness, weakness, light-headedness, numbness and headaches.      Allergies  Review of patient's allergies indicates no known allergies.  Home Medications   Prior to Admission medications   Medication Sig Start Date End Date Taking? Authorizing Provider  lisinopril (PRINIVIL,ZESTRIL) 20 MG tablet Take 20 mg by mouth daily. PT NOW TAKING 10 MG, SINCE 02-07-13    Historical Provider, MD  loratadine (CLARITIN) 10 MG tablet Take 10 mg by mouth daily.    Historical Provider, MD  pravastatin (PRAVACHOL) 40 MG tablet Take 40 mg by mouth daily.    Historical Provider, MD  rOPINIRole (REQUIP) 0.5 MG tablet Take 0.5 mg by mouth at bedtime as needed.    Historical Provider, MD   BP 139/83 mmHg  Pulse 77  Temp(Src) 97.8 F (36.6 C) (Oral)  Resp 18  SpO2 98% Physical Exam  Constitutional: He is oriented to person, place, and time. He appears well-developed and well-nourished. No distress.  HENT:  Head: Normocephalic and atraumatic.  Eyes: Conjunctivae are normal.  Neck: Neck supple.  Cardiovascular: Normal rate, regular rhythm and normal heart sounds.   Pulmonary/Chest: Effort normal. No respiratory distress. He has no wheezes. He has no rales.  Abdominal: Soft. Bowel sounds are normal. He exhibits no distension. There is no tenderness. There is no rebound.  Right CVA tenderness  Musculoskeletal: He exhibits no edema.  Neurological: He is alert and oriented to person, place, and time.  Skin: Skin is warm and dry.  Nursing note and vitals reviewed.   ED  Course  Procedures (including critical care time) Labs Review Labs Reviewed  COMPREHENSIVE METABOLIC PANEL - Abnormal; Notable for the following:    CO2 20 (*)    Creatinine, Ser 2.21 (*)    GFR calc non Af Amer 32 (*)    GFR calc Af Amer 37 (*)    All other components within normal limits  CBC WITH DIFFERENTIAL/PLATELET -  Abnormal; Notable for the following:    WBC 12.7 (*)    Neutrophils Relative % 81 (*)    Neutro Abs 10.2 (*)    Lymphocytes Relative 10 (*)    Monocytes Absolute 1.1 (*)    All other components within normal limits  URINALYSIS, ROUTINE W REFLEX MICROSCOPIC    Imaging Review Ct Abdomen Pelvis Wo Contrast  12/12/2014   CLINICAL DATA:  Nephrolithiasis  EXAM: CT ABDOMEN AND PELVIS WITHOUT CONTRAST  TECHNIQUE: Multidetector CT imaging of the abdomen and pelvis was performed following the standard protocol without IV contrast.  COMPARISON:  None.  FINDINGS: The lung bases are unremarkable.  Sagittal images of the spine shows significant disc space flattening with vacuum disc phenomenon at L5-S1 level. Unenhanced liver shows no biliary ductal dilatation. No calcified gallstones are noted within gallbladder. Unenhanced pancreas, spleen and adrenal glands are unremarkable. At least 4 or 5 nonobstructive calcifications are noted within right kidney the largest in midpole measures 4.9 mm. At least 2 nonobstructive calcifications are noted within left kidney the largest in lower pole measures 7.5 mm. There is a probable cyst in midpole posterior aspect of the right kidney measures 2.7 cm. There is mild right hydronephrosis and right hydroureter. Mild right perinephric and periureteral stranding.  In axial image 70 there is calcified obstructive calculus in distal right ureter measures 5 mm at the lower level of right SI joint. Bilateral distal ureter is not distended. No calcified calculi are noted within under distended urinary bladder. Prostate gland and seminal vesicles are unremarkable.  No small bowel obstruction. No pericecal inflammation. No ascites or free air. No adenopathy. The terminal ileum is unremarkable. Normal appendix partially visualized in coronal image 45.  IMPRESSION: 1. There is bilateral nonobstructive nephrolithiasis. 2. Mild right hydronephrosis and right hydroureter. There is mild right  perinephric and periureteral stranding. 3. Axial image 78 there is 5 mm calcified obstructive calculus in distal right ureter at lower level of right SI joint. 4. No calcified calculi are noted within urinary bladder. 5. No small bowel obstruction. 6. No pericecal inflammation.  Normal appendix.   Electronically Signed   By: Lahoma Crocker M.D.   On: 12/12/2014 16:33     EKG Interpretation None      MDM   Final diagnoses:  Kidney stone     patient emergency department with right flank pain and right lower abdominal pain, pain similar to prior kidney stones. Abdomen is benign. Will start pain medications, labs, urinalysis, CT abdomen and pelvis ordered.  2:58 PM Pain improved after Toradol 30 mg IV, Dilaudid 0.5 mg IV, Zofran 4 mg IV. Pending urinalysis and CT. Creatinine is elevated at 2.21, possibly from dehydration, patient states he hasn't been able to eat or drink anything in a couple of days. Also could be related to a kidney abnormality. IV fluids ordered.  4:57 PM 33mm calculus in right ureter at right SI level. Discussed with Dr. Alinda Money with urology, given creatinine of 2.2, advised close follow up with them. They will recheck his creatinine. I hydrated him here with IV saline. He is  tolerating by mouth's. Bladder scanner showed less than 200 mL in the bladder. Pain controlled. Plan to d/c home zofran, percocet, follow up with urology. i did tell him if his symptoms are worsening to return to ER  Filed Vitals:   12/12/14 1500 12/12/14 1515 12/12/14 1530 12/12/14 1645  BP: 126/82 128/71 123/77 122/71  Pulse: 76 78 84 79  Temp:      TempSrc:      Resp:      SpO2: 96% 95% 97% 96%     Jeannett Senior, PA-C 12/12/14 1711  Ernestina Patches, MD 12/12/14 2054

## 2014-12-12 NOTE — ED Notes (Signed)
Bladder scan shows 163mL of urine in bladder pre-void.

## 2014-12-12 NOTE — ED Notes (Signed)
Pt is in stable condition upon d/c and ambulates from ED. 

## 2014-12-12 NOTE — Discharge Instructions (Signed)
Continue pain medications as prescribed. flomax daily. If unable to control your symptoms, return to ER! Otherwise follow up with urology, call them today for an apt in the next several days.    Kidney Stones Kidney stones (urolithiasis) are deposits that form inside your kidneys. The intense pain is caused by the stone moving through the urinary tract. When the stone moves, the ureter goes into spasm around the stone. The stone is usually passed in the urine.  CAUSES   A disorder that makes certain neck glands produce too much parathyroid hormone (primary hyperparathyroidism).  A buildup of uric acid crystals, similar to gout in your joints.  Narrowing (stricture) of the ureter.  A kidney obstruction present at birth (congenital obstruction).  Previous surgery on the kidney or ureters.  Numerous kidney infections. SYMPTOMS   Feeling sick to your stomach (nauseous).  Throwing up (vomiting).  Blood in the urine (hematuria).  Pain that usually spreads (radiates) to the groin.  Frequency or urgency of urination. DIAGNOSIS   Taking a history and physical exam.  Blood or urine tests.  CT scan.  Occasionally, an examination of the inside of the urinary bladder (cystoscopy) is performed. TREATMENT   Observation.  Increasing your fluid intake.  Extracorporeal shock wave lithotripsy--This is a noninvasive procedure that uses shock waves to break up kidney stones.  Surgery may be needed if you have severe pain or persistent obstruction. There are various surgical procedures. Most of the procedures are performed with the use of small instruments. Only small incisions are needed to accommodate these instruments, so recovery time is minimized. The size, location, and chemical composition are all important variables that will determine the proper choice of action for you. Talk to your health care provider to better understand your situation so that you will minimize the risk of  injury to yourself and your kidney.  HOME CARE INSTRUCTIONS   Drink enough water and fluids to keep your urine clear or pale yellow. This will help you to pass the stone or stone fragments.  Strain all urine through the provided strainer. Keep all particulate matter and stones for your health care provider to see. The stone causing the pain may be as small as a grain of salt. It is very important to use the strainer each and every time you pass your urine. The collection of your stone will allow your health care provider to analyze it and verify that a stone has actually passed. The stone analysis will often identify what you can do to reduce the incidence of recurrences.  Only take over-the-counter or prescription medicines for pain, discomfort, or fever as directed by your health care provider.  Make a follow-up appointment with your health care provider as directed.  Get follow-up X-rays if required. The absence of pain does not always mean that the stone has passed. It may have only stopped moving. If the urine remains completely obstructed, it can cause loss of kidney function or even complete destruction of the kidney. It is your responsibility to make sure X-rays and follow-ups are completed. Ultrasounds of the kidney can show blockages and the status of the kidney. Ultrasounds are not associated with any radiation and can be performed easily in a matter of minutes. SEEK MEDICAL CARE IF:  You experience pain that is progressive and unresponsive to any pain medicine you have been prescribed. SEEK IMMEDIATE MEDICAL CARE IF:   Pain cannot be controlled with the prescribed medicine.  You have a fever or shaking  chills.  The severity or intensity of pain increases over 18 hours and is not relieved by pain medicine.  You develop a new onset of abdominal pain.  You feel faint or pass out.  You are unable to urinate. MAKE SURE YOU:   Understand these instructions.  Will watch your  condition.  Will get help right away if you are not doing well or get worse. Document Released: 07/12/2005 Document Revised: 03/14/2013 Document Reviewed: 12/13/2012 [facility] Patient Information 2015 [place], [place]. This information is not intended to replace advice given to you by your health care provider. Make sure you discuss any questions you have with your health care provider.

## 2014-12-12 NOTE — ED Notes (Signed)
Sprite Zero given to pt for PO challenge

## 2015-08-27 IMAGING — CT CT ABD-PELV W/O CM
2 of 4 series · 16 of 46 positions shown, 18 images · non-contrast
Comparison: None.

CLINICAL DATA: Nephrolithiasis

EXAM:
CT ABDOMEN AND PELVIS WITHOUT CONTRAST
TECHNIQUE: Multidetector CT imaging of the abdomen and pelvis was performed
following the standard protocol without IV contrast.

[Series 2: stone study 5.0 i30f 1 · axial · 0.76mm/px · z∈[+1037,+1422]mm · 13 of 85 slices shown, 15 images]
[im 4/85  soft-tissue]
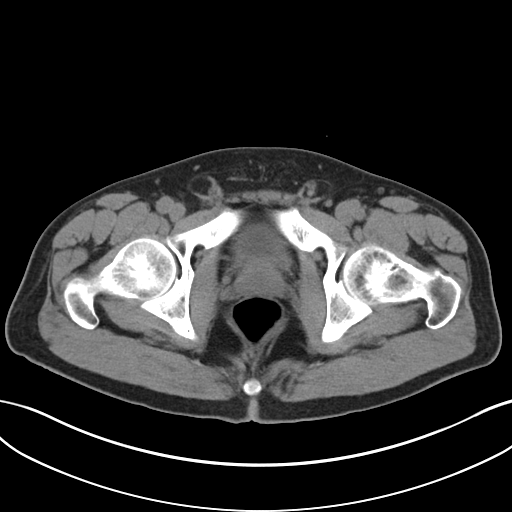
[im 4/85  bone]
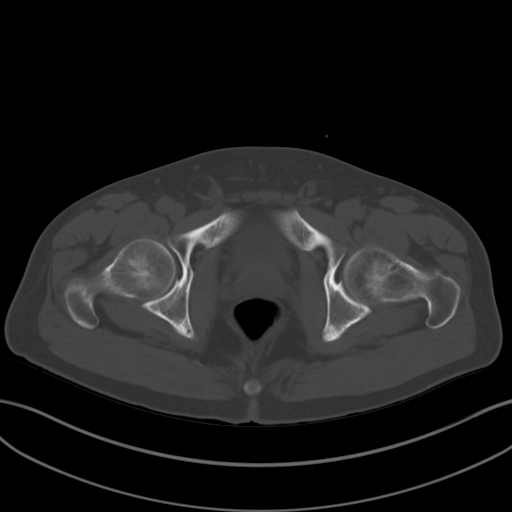
[im 11/85  soft-tissue]
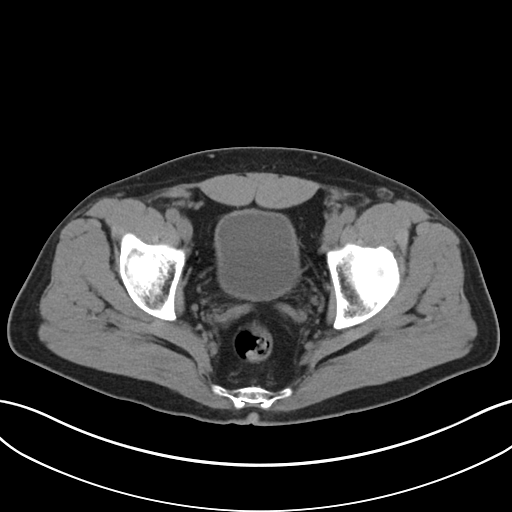
[im 17/85  soft-tissue]
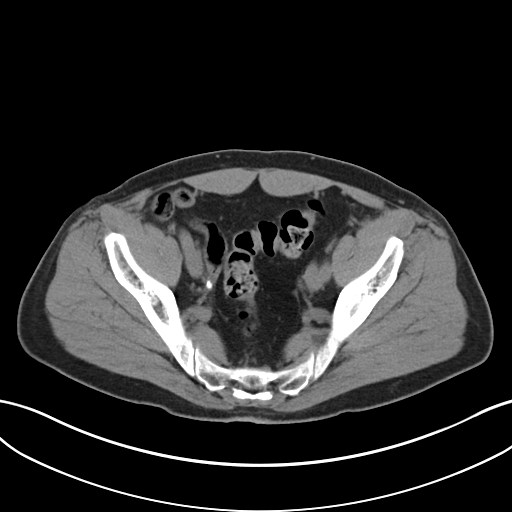
[im 24/85  soft-tissue]
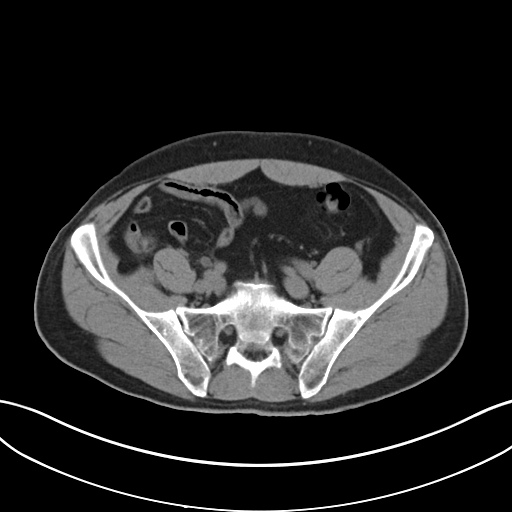
[im 31/85  soft-tissue]
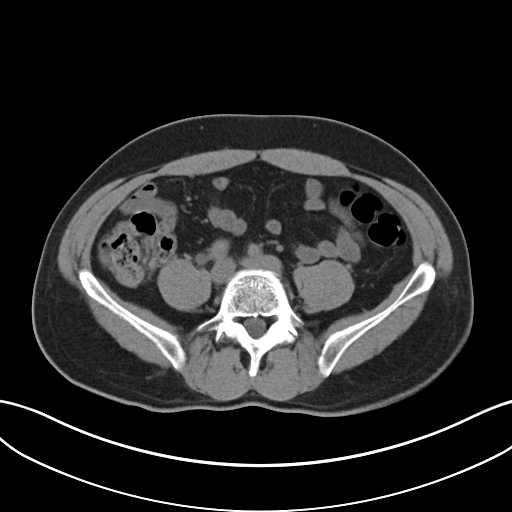
[im 37/85  soft-tissue]
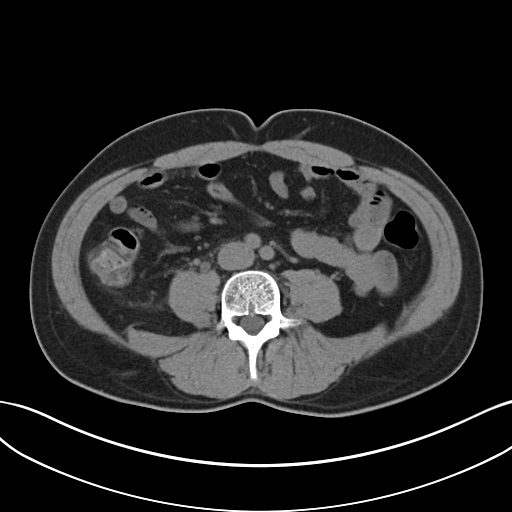
[im 44/85  soft-tissue]
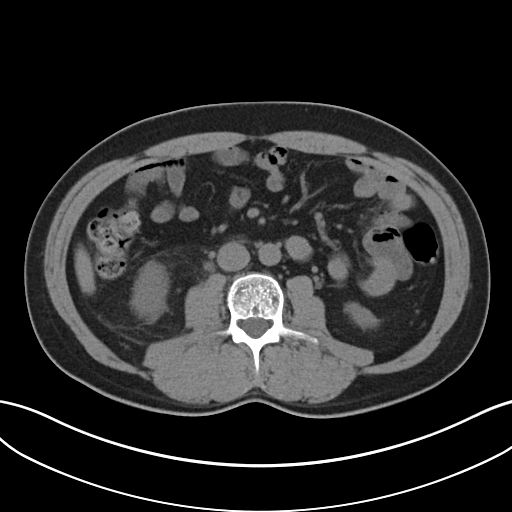
[im 48/85  soft-tissue]
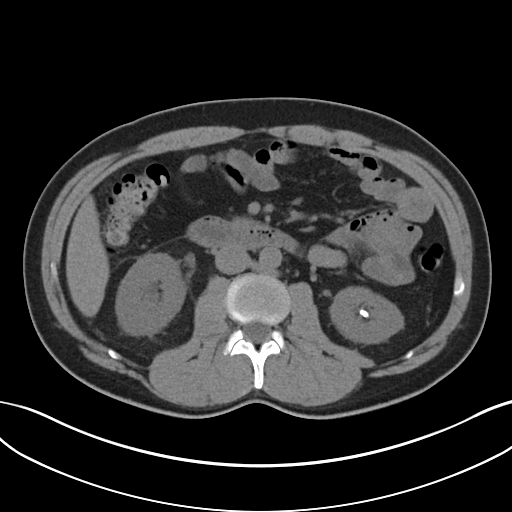
[im 54/85  soft-tissue]
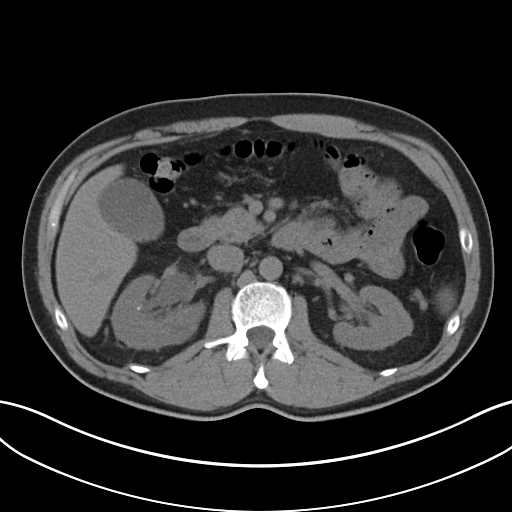
[im 54/85  bone]
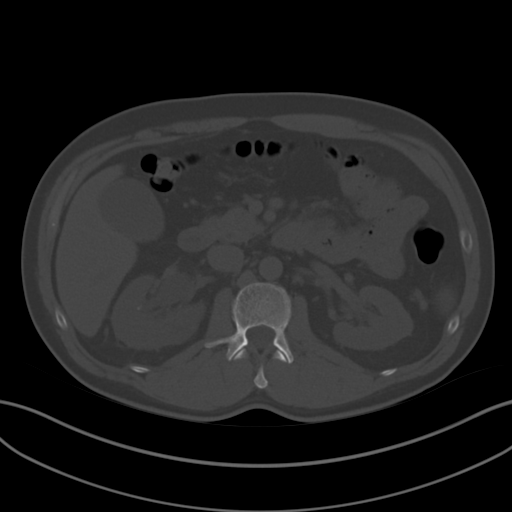
[im 61/85  soft-tissue]
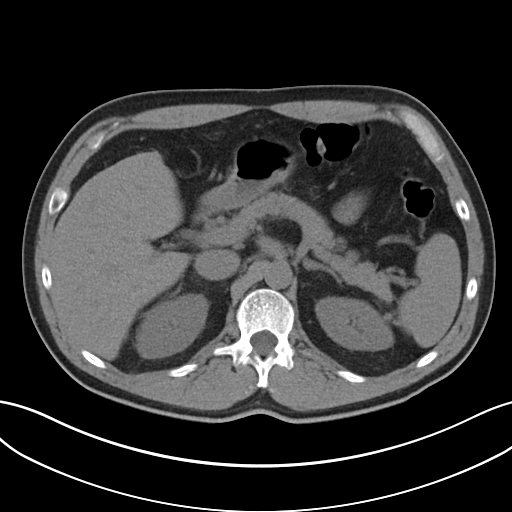
[im 68/85  soft-tissue]
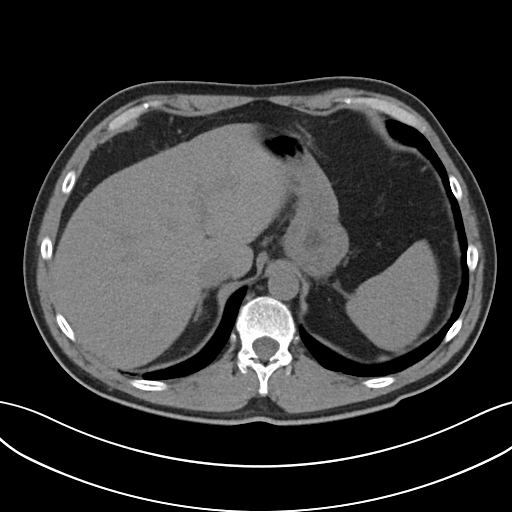
[im 74/85  soft-tissue]
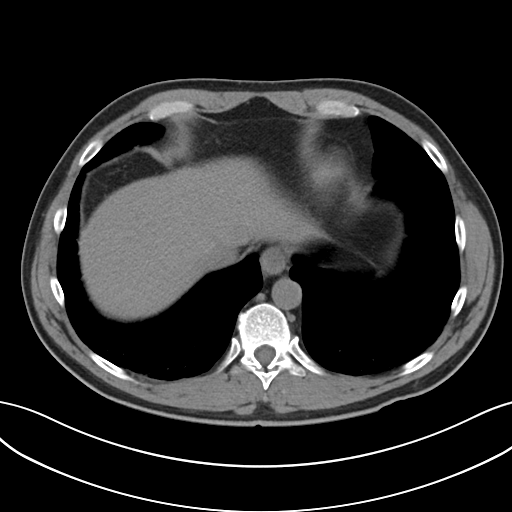
[im 81/85  soft-tissue]
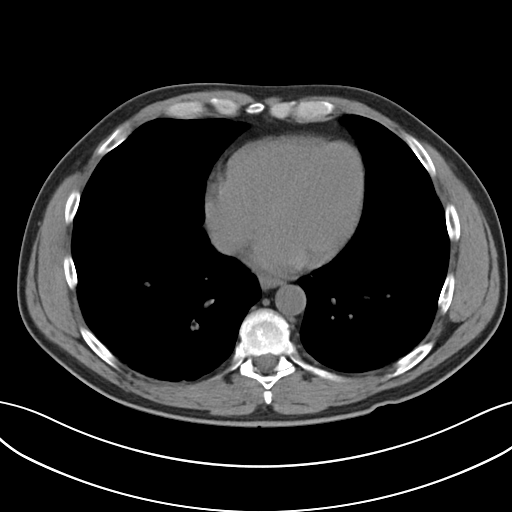

[Series 5: coronal soft tissue · coronal · 0.76mm/px · 3 of 90 slices shown]
[im 30/90  soft-tissue]
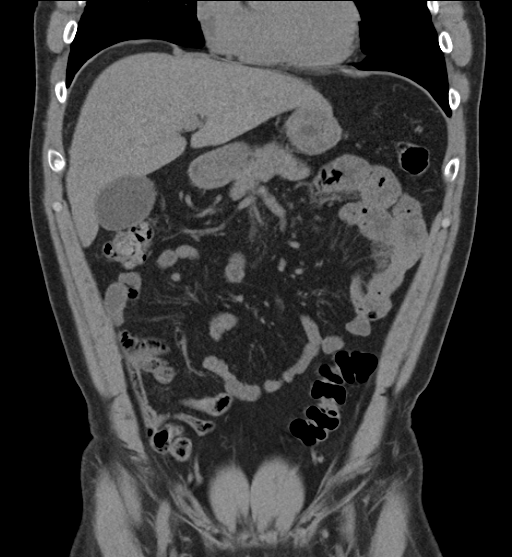
[im 40/90  soft-tissue]
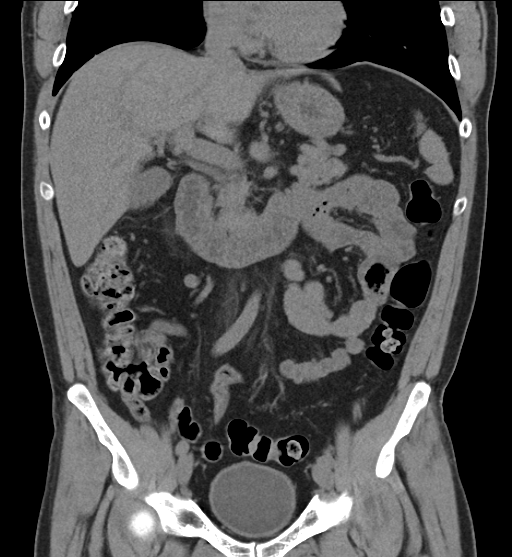
[im 50/90  soft-tissue]
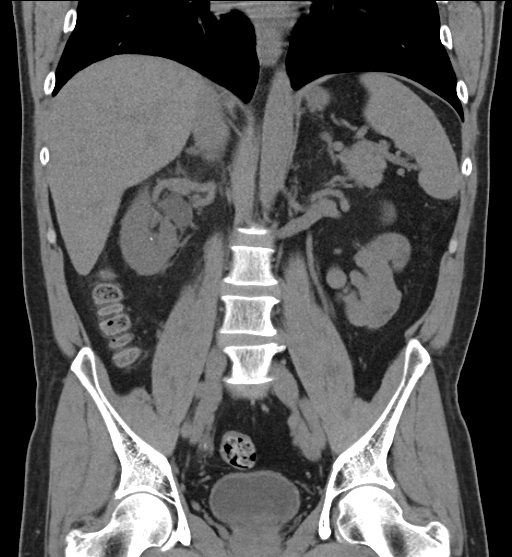

[16 of 46 positions shown; findings below may reference images not displayed]

FINDINGS: The lung bases are unremarkable.

Sagittal images of the spine shows significant disc space flattening
with vacuum disc phenomenon at L5-S1 level. Unenhanced liver shows
no biliary ductal dilatation. No calcified gallstones are noted
within gallbladder. Unenhanced pancreas, spleen and adrenal glands
are unremarkable. At least 4 or 5 nonobstructive calcifications are
noted within right kidney the largest in midpole measures 4.9 mm. At
least 2 nonobstructive calcifications are noted within left kidney
the largest in lower pole measures 7.5 mm. There is a probable cyst
in midpole posterior aspect of the right kidney measures 2.7 cm.
There is mild right hydronephrosis and right hydroureter. Mild right
perinephric and periureteral stranding.

In axial image 70 there is calcified obstructive calculus in distal
right ureter measures 5 mm at the lower level of right SI joint.
Bilateral distal ureter is not distended. No calcified calculi are
noted within under distended urinary bladder. Prostate gland and
seminal vesicles are unremarkable.

No small bowel obstruction. No pericecal inflammation. No ascites or
free air. No adenopathy. The terminal ileum is unremarkable. Normal
appendix partially visualized in coronal image 45.
IMPRESSION: 1. There is bilateral nonobstructive nephrolithiasis.
2. Mild right hydronephrosis and right hydroureter. There is mild
right perinephric and periureteral stranding.
3. Axial image 78 there is 5 mm calcified obstructive calculus in
distal right ureter at lower level of right SI joint.
4. No calcified calculi are noted within urinary bladder.
5. No small bowel obstruction.
6. No pericecal inflammation.  Normal appendix.

## 2016-11-17 DIAGNOSIS — L718 Other rosacea: Secondary | ICD-10-CM | POA: Diagnosis not present

## 2016-11-17 DIAGNOSIS — L570 Actinic keratosis: Secondary | ICD-10-CM | POA: Diagnosis not present

## 2016-12-01 DIAGNOSIS — L570 Actinic keratosis: Secondary | ICD-10-CM | POA: Diagnosis not present

## 2017-05-03 DIAGNOSIS — I10 Essential (primary) hypertension: Secondary | ICD-10-CM | POA: Diagnosis not present

## 2017-05-03 DIAGNOSIS — B009 Herpesviral infection, unspecified: Secondary | ICD-10-CM | POA: Diagnosis not present

## 2017-05-03 DIAGNOSIS — E7800 Pure hypercholesterolemia, unspecified: Secondary | ICD-10-CM | POA: Diagnosis not present

## 2017-05-03 DIAGNOSIS — Z23 Encounter for immunization: Secondary | ICD-10-CM | POA: Diagnosis not present

## 2017-10-13 ENCOUNTER — Other Ambulatory Visit: Payer: Self-pay | Admitting: Family Medicine

## 2017-10-13 DIAGNOSIS — K4090 Unilateral inguinal hernia, without obstruction or gangrene, not specified as recurrent: Secondary | ICD-10-CM | POA: Diagnosis not present

## 2017-10-13 DIAGNOSIS — R1310 Dysphagia, unspecified: Secondary | ICD-10-CM

## 2017-10-19 ENCOUNTER — Ambulatory Visit
Admission: RE | Admit: 2017-10-19 | Discharge: 2017-10-19 | Disposition: A | Discharge status: Home / Self Care | Payer: 59 | Source: Ambulatory Visit | Attending: Family Medicine | Admitting: Family Medicine

## 2017-10-19 DIAGNOSIS — R1310 Dysphagia, unspecified: Secondary | ICD-10-CM

## 2017-10-19 DIAGNOSIS — K449 Diaphragmatic hernia without obstruction or gangrene: Secondary | ICD-10-CM | POA: Diagnosis not present

## 2017-10-26 DIAGNOSIS — L570 Actinic keratosis: Secondary | ICD-10-CM | POA: Diagnosis not present

## 2017-10-26 DIAGNOSIS — L821 Other seborrheic keratosis: Secondary | ICD-10-CM | POA: Diagnosis not present

## 2017-10-26 DIAGNOSIS — B079 Viral wart, unspecified: Secondary | ICD-10-CM | POA: Diagnosis not present

## 2017-10-27 ENCOUNTER — Ambulatory Visit: Payer: Self-pay | Admitting: Surgery

## 2017-10-27 DIAGNOSIS — K4090 Unilateral inguinal hernia, without obstruction or gangrene, not specified as recurrent: Secondary | ICD-10-CM | POA: Diagnosis not present

## 2017-10-27 NOTE — H&P (Signed)
Brent Odom Documented: 10/27/2017 8:44 AM Location: [facility] Patient #: [id number] DOB: 1960-10-14 Married / Language: Cleophus Molt / Race: White Male  History of Present Illness (Jayden Rudge A. Kae Heller MD; 10/27/2017 9:12 AM) Patient words: 57 year old male referred by Dr. Marisue Humble at [facility] to evaluate right inguinal hernia. The patient noticed a lump in his right groin about 6 weeks ago associated with sharp pain when he was exercising/ lifting weights. About 3 weeks later he was lifting weights again and felt the same searing pain but did not have any visible abnormalities that time. A couple weeks later he then noticed a visible bulge in his right upper inguinal area, and this has persisted. He does not know if he can reduce it. He has since restricted his weight lifting, and now does thousand reps the day of later weights. No nausea or vomiting, no change in bowel habits or urinary complaints. He continues to experience significant pain in the area with any activities including getting in and out of his car, gardening, etc.  At that visit he also complained of dysphagia and subsequently had an upper GI that does show a small hiatal hernia with mild GERD, mild benign stricture at GE junction which did not impede passage of the barium tablet.  His medical history is notable for hypertension, hypercholesterolemia, restless leg syndrome, kidney stones, hemochromatosis, migraine, CKD stage III. Has not required any phlebotomy for the hemochromatosis in about 3 years.  No prior surgeries.  He is a nonsmoker, works from home, IT sales professional and agents. His wife who is a Marine scientist is here with him today.  The patient is a 57 year old male.   Past Surgical History (April Staton, CMA; 10/27/2017 8:47 AM) Shoulder Surgery Right. Tonsillectomy  Diagnostic Studies History (April Staton, [place]; 10/27/2017 8:47 AM) Colonoscopy within last year  Allergies (April Staton,  CMA; 10/27/2017 8:48 AM) Penicillins  Medication History (April Staton, CMA; 10/27/2017 8:55 AM) Omeprazole (20MG  Capsule DR, Oral) Active. Acyclovir (200MG  Capsule, Oral) Active. Claritin (10MG  Capsule, Oral) Active. Fish Oil (1000MG  Capsule, Oral) Active. Lisinopril (5MG  Tablet, Oral) Active. Lutein-Zeaxanthin (25-5MG  Capsule, Oral) Active. Multivitamin Adults (Oral) Active. Turmeric (500MG  Tablet, Oral) Active. Vitamin D3 (2000UNIT Tablet, Oral) Active. Flonase (50MCG/DOSE Inhaler, Nasal) Active. Atorvastatin Calcium (10MG  Tablet, Oral) Active. Saw Palmetto (Oral) Specific strength unknown - Active. Medications Reconciled  Social History (April Staton, [place]; 10/27/2017 8:47 AM) Alcohol use Moderate alcohol use. Caffeine use Coffee. No drug use Tobacco use Never smoker.  Family History (April Staton, [place]; 10/27/2017 8:47 AM) Alcohol Abuse Father, Sister. Heart Disease Father. Heart disease in male family member before age 95 Hypertension Father. Melanoma Father. Respiratory Condition Father.  Other Problems (April Staton, CMA; 10/27/2017 8:47 AM) Gastroesophageal Reflux Disease High blood pressure Hypercholesterolemia Inguinal Hernia Kidney Stone Other disease, cancer, significant illness     Review of Systems (April Staton CMA; 10/27/2017 8:47 AM) General Not Present- Appetite Loss, Chills, Fatigue, Fever, Night Sweats, Weight Gain and Weight Loss. Skin Not Present- Change in Wart/Mole, Dryness, Hives, Jaundice, New Lesions, Non-Healing Wounds, Rash and Ulcer. HEENT Present- Seasonal Allergies and Wears glasses/contact lenses. Not Present- Earache, Hearing Loss, Hoarseness, Nose Bleed, Oral Ulcers, Ringing in the Ears, Sinus Pain, Sore Throat, Visual Disturbances and Yellow Eyes. Respiratory Not Present- Bloody sputum, Chronic Cough, Difficulty Breathing, Snoring and Wheezing. Breast Not Present- Breast Mass, Breast Pain, Nipple Discharge and Skin  Changes. Cardiovascular Not Present- Chest Pain, Difficulty Breathing Lying Down, Leg Cramps, Palpitations, Rapid Heart Rate, Shortness of  Breath and Swelling of Extremities. Gastrointestinal Present- Abdominal Pain. Not Present- Bloating, Bloody Stool, Change in Bowel Habits, Chronic diarrhea, Constipation, Difficulty Swallowing, Excessive gas, Gets full quickly at meals, Hemorrhoids, Indigestion, Nausea, Rectal Pain and Vomiting. Male Genitourinary Not Present- Blood in Urine, Change in Urinary Stream, Frequency, Impotence, Nocturia, Painful Urination, Urgency and Urine Leakage. Musculoskeletal Present- Swelling of Extremities. Not Present- Back Pain, Joint Pain, Joint Stiffness, Muscle Pain and Muscle Weakness. Neurological Not Present- Decreased Memory, Fainting, Headaches, Numbness, Seizures, Tingling, Tremor, Trouble walking and Weakness. Psychiatric Not Present- Anxiety, Bipolar, Change in Sleep Pattern, Depression, Fearful and Frequent crying. Endocrine Not Present- Cold Intolerance, Excessive Hunger, Hair Changes, Heat Intolerance, Hot flashes and New Diabetes. Hematology Not Present- Blood Thinners, Easy Bruising, Excessive bleeding, Gland problems, HIV and Persistent Infections.  Vitals (April Staton CMA; 10/27/2017 8:49 AM) 10/27/2017 8:49 AM Weight: 178.38 lb Height: 69in Body Surface Area: 1.97 m Body Mass Index: 26.34 kg/m  Temp.: 98.12F(Oral)  Pulse: 69 (Regular)  BP: 122/92 (Sitting, Left Arm, Standard)      Physical Exam (Havier Deeb A. Kae Heller MD; 10/27/2017 9:11 AM)  The physical exam findings are as follows: Note:Gen: alert and well appearing Eye: extraocular motion intact, no scleral icterus ENT: moist mucus membranes, dentition intact Neck: no mass or thyromegaly Chest: unlabored respirations, symmetrical air entry, clear bilaterally CV: regular rate and rhythm, no pedal edema Abdomen: soft, nontender, nondistended. No mass or organomegaly. Reducible right  inguinal hernia, no hernia on the left or the umbilicus. MSK: strength symmetrical throughout, no deformity Neuro: grossly intact, normal gait Psych: normal mood and affect, appropriate insight Skin: warm and dry, no rash or lesion on limited exam    Assessment & Plan (Demita Tobia A. Kae Heller MD; 10/27/2017 9:12 AM)  RIGHT INGUINAL HERNIA (K40.90) Story: Reducible, but symptomatic with significant pain. I recommended repair and we discussed options of open versus laparoscopic including the risks and benefits of each. He is very concerned about downsizing was to get back to exercising and working as soon as possible. I therefore recommended laparoscopic approach. We discussed the technique of the operation, the use of mesh, the risks of bleeding, infection, pain, scarring, intra-abdominal injury or injury to the pelvic structures. Discussed the risk of hernia recurrence. Discussed the six-week no lifting restriction. Questions were welcomed and answered. We will schedule for surgery.

## 2017-11-23 NOTE — Progress Notes (Addendum)
PCP: Gaynelle Arabian, MD  Cardiologist: pt denies  EKG: 11/24/17  Stress test: pt reports 10+ years ago  ECHO: pt denies ever  Cardiac Cath: pt denies ever  Chest x-ray: pt denies past year, no recent respiratory infections/complications

## 2017-11-23 NOTE — Pre-Procedure Instructions (Signed)
Brent Odom  11/23/2017      [facility] # [place], [place] [address] [place] [address] Phone: [phone] Fax: [phone]    Your procedure is scheduled on Dec 02, 2017.  Report to [facility] Admitting at 930 AM.  Call this number if you have problems the morning of surgery:  [phone]   Continue all medications as directed by your physician except follow these medication instructions before surgery below   Remember:  Do not eat food or drink liquids after midnight.  Take these medicines the morning of surgery with A SIP OF WATER  flonase nasal spray-if needed Loratadine (claritin) Omeprazole (prilosec)  7 days prior to surgery STOP taking any Aspirin (unless otherwise instructed by your surgeon), Aleve, Naproxen, Ibuprofen, Motrin, Advil, Goody's, BC's, all herbal medications, fish oil, and all vitamins    Contacts, dentures or bridgework may not be worn into surgery.  Leave your suitcase in the car.  After surgery it may be brought to your room.  For patients admitted to the hospital, discharge time will be determined by your treatment team.  Patients discharged the day of surgery will not be allowed to drive home.   [facility] [place]- Preparing For Surgery  Before surgery, you can play an important role. Because skin is not sterile, your skin needs to be as free of germs as possible. You can reduce the number of germs on your skin by washing with CHG (chlorahexidine gluconate) Soap before surgery.  CHG is an antiseptic cleaner which kills germs and bonds with the skin to continue killing germs even after washing.  Please do not use if you have an allergy to CHG or antibacterial soaps. If your skin becomes reddened/irritated stop using the CHG.  Do not shave (including legs and underarms) for at least 48 hours prior to first CHG shower. It is OK to shave your face.  Please follow these instructions  carefully.   1. Shower the NIGHT BEFORE SURGERY and the MORNING OF SURGERY with CHG.   2. If you chose to wash your hair, wash your hair first as usual with your normal shampoo.  3. After you shampoo, rinse your hair and body thoroughly to remove the shampoo.  4. Use CHG as you would any other liquid soap. You can apply CHG directly to the skin and wash gently with a scrungie or a clean washcloth.   5. Apply the CHG Soap to your body ONLY FROM THE NECK DOWN.  Do not use on open wounds or open sores. Avoid contact with your eyes, ears, mouth and genitals (private parts). Wash Face and genitals (private parts)  with your normal soap.  6. Wash thoroughly, paying special attention to the area where your surgery will be performed.  7. Thoroughly rinse your body with warm water from the neck down.  8. DO NOT shower/wash with your normal soap after using and rinsing off the CHG Soap.  9. Pat yourself dry with a CLEAN TOWEL.  10. Wear CLEAN PAJAMAS to bed the night before surgery, wear comfortable clothes the morning of surgery  11. Place CLEAN SHEETS on your bed the night of your first shower and DO NOT SLEEP WITH PETS.  Day of Surgery: Do not apply any deodorants/lotions. Please wear clean clothes to the hospital/surgery center.     Do not wear jewelry.  Do not wear lotions, powders, or colognes, or deodorant.  Men may  shave face and neck.  Do not bring valuables to the hospital.   [facility] is not responsible for any belongings or valuables.  Please read over the following fact sheets that you were given. Pain Booklet, Coughing and Deep Breathing and Surgical Site Infection Prevention

## 2017-11-24 ENCOUNTER — Other Ambulatory Visit: Payer: Self-pay

## 2017-11-24 ENCOUNTER — Encounter (HOSPITAL_COMMUNITY): Payer: Self-pay

## 2017-11-24 ENCOUNTER — Encounter (HOSPITAL_COMMUNITY)
Admission: RE | Admit: 2017-11-24 | Discharge: 2017-11-24 | Disposition: A | Discharge status: Home / Self Care | Payer: Managed Care, Other (non HMO) | Source: Ambulatory Visit | Attending: Surgery | Admitting: Surgery

## 2017-11-24 DIAGNOSIS — Z01810 Encounter for preprocedural cardiovascular examination: Secondary | ICD-10-CM | POA: Insufficient documentation

## 2017-11-24 DIAGNOSIS — Z01812 Encounter for preprocedural laboratory examination: Secondary | ICD-10-CM | POA: Diagnosis not present

## 2017-11-24 HISTORY — DX: Unilateral inguinal hernia, without obstruction or gangrene, not specified as recurrent: K40.90

## 2017-11-24 HISTORY — DX: Hyperlipidemia, unspecified: E78.5

## 2017-11-24 HISTORY — DX: Unspecified osteoarthritis, unspecified site: M19.90

## 2017-11-24 HISTORY — DX: Personal history of urinary calculi: Z87.442

## 2017-11-24 LAB — BASIC METABOLIC PANEL
ANION GAP: 10 (ref 5–15)
BUN: 23 mg/dL — ABNORMAL HIGH (ref 6–20)
CHLORIDE: 103 mmol/L (ref 101–111)
CO2: 23 mmol/L (ref 22–32)
CREATININE: 1.43 mg/dL — AB (ref 0.61–1.24)
Calcium: 9.3 mg/dL (ref 8.9–10.3)
GFR calc non Af Amer: 53 mL/min — ABNORMAL LOW (ref >60)
Glucose, Bld: 100 mg/dL — ABNORMAL HIGH (ref 65–99)
POTASSIUM: 4 mmol/L (ref 3.5–5.1)
SODIUM: 136 mmol/L (ref 135–145)

## 2017-11-24 LAB — CBC WITH DIFFERENTIAL/PLATELET
Basophils Absolute: 0 10*3/uL (ref 0.0–0.1)
Basophils Relative: 0 %
EOS ABS: 0.1 10*3/uL (ref 0.0–0.7)
Eosinophils Relative: 2 %
HCT: 42.6 % (ref 39.0–52.0)
HEMOGLOBIN: 14.8 g/dL (ref 13.0–17.0)
LYMPHS ABS: 2 10*3/uL (ref 0.7–4.0)
Lymphocytes Relative: 24 %
MCH: 30.6 pg (ref 26.0–34.0)
MCHC: 34.7 g/dL (ref 30.0–36.0)
MCV: 88.2 fL (ref 78.0–100.0)
Monocytes Absolute: 0.6 10*3/uL (ref 0.1–1.0)
Monocytes Relative: 7 %
NEUTROS ABS: 5.5 10*3/uL (ref 1.7–7.7)
NEUTROS PCT: 67 %
Platelets: 192 10*3/uL (ref 150–400)
RBC: 4.83 MIL/uL (ref 4.22–5.81)
RDW: 12.4 % (ref 11.5–15.5)
WBC: 8.2 10*3/uL (ref 4.0–10.5)

## 2017-11-24 MED ORDER — CHLORHEXIDINE GLUCONATE 4 % EX LIQD: 60.0000 mL | Freq: Once | CUTANEOUS | Status: DC

## 2017-12-02 ENCOUNTER — Ambulatory Visit (HOSPITAL_COMMUNITY): Payer: 59 | Admitting: Anesthesiology

## 2017-12-02 ENCOUNTER — Encounter (HOSPITAL_COMMUNITY)
Admission: RE | Disposition: A | Discharge status: Home / Self Care | Payer: Self-pay | Source: Ambulatory Visit | Attending: Surgery

## 2017-12-02 ENCOUNTER — Ambulatory Visit (HOSPITAL_COMMUNITY)
Admission: RE | Admit: 2017-12-02 | Discharge: 2017-12-02 | Disposition: A | Discharge status: Home / Self Care | Payer: 59 | Source: Ambulatory Visit | Attending: Surgery | Admitting: Surgery

## 2017-12-02 DIAGNOSIS — N183 Chronic kidney disease, stage 3 (moderate): Secondary | ICD-10-CM | POA: Insufficient documentation

## 2017-12-02 DIAGNOSIS — K4090 Unilateral inguinal hernia, without obstruction or gangrene, not specified as recurrent: Secondary | ICD-10-CM | POA: Diagnosis not present

## 2017-12-02 DIAGNOSIS — K219 Gastro-esophageal reflux disease without esophagitis: Secondary | ICD-10-CM | POA: Diagnosis not present

## 2017-12-02 DIAGNOSIS — E7800 Pure hypercholesterolemia, unspecified: Secondary | ICD-10-CM | POA: Diagnosis not present

## 2017-12-02 DIAGNOSIS — I10 Essential (primary) hypertension: Secondary | ICD-10-CM | POA: Diagnosis not present

## 2017-12-02 DIAGNOSIS — Z79899 Other long term (current) drug therapy: Secondary | ICD-10-CM | POA: Diagnosis not present

## 2017-12-02 DIAGNOSIS — D176 Benign lipomatous neoplasm of spermatic cord: Secondary | ICD-10-CM | POA: Diagnosis not present

## 2017-12-02 DIAGNOSIS — G2581 Restless legs syndrome: Secondary | ICD-10-CM | POA: Diagnosis not present

## 2017-12-02 DIAGNOSIS — Z880 Allergy status to penicillin: Secondary | ICD-10-CM | POA: Diagnosis not present

## 2017-12-02 DIAGNOSIS — I129 Hypertensive chronic kidney disease with stage 1 through stage 4 chronic kidney disease, or unspecified chronic kidney disease: Secondary | ICD-10-CM | POA: Insufficient documentation

## 2017-12-02 HISTORY — PX: INGUINAL HERNIA REPAIR: SHX194

## 2017-12-02 SURGERY — REPAIR, HERNIA, INGUINAL, LAPAROSCOPIC
Anesthesia: General | Site: Inguinal | Laterality: Right

## 2017-12-02 MED ORDER — ONDANSETRON HCL 4 MG/2ML IJ SOLN
INTRAMUSCULAR | Status: DC | PRN
  Administered 2017-12-02: 4 mg via INTRAVENOUS

## 2017-12-02 MED ORDER — PROPOFOL 10 MG/ML IV BOLUS
INTRAVENOUS | Status: DC | PRN
  Administered 2017-12-02: 170 mg via INTRAVENOUS

## 2017-12-02 MED ORDER — ACETAMINOPHEN 325 MG PO TABS: 650.0000 mg | ORAL_TABLET | ORAL | Status: DC | PRN

## 2017-12-02 MED ORDER — OXYCODONE HCL 5 MG PO TABS
ORAL_TABLET | ORAL | Status: AC
  Filled 2017-12-02: qty 1

## 2017-12-02 MED ORDER — SUGAMMADEX SODIUM 200 MG/2ML IV SOLN
INTRAVENOUS | Status: DC | PRN
  Administered 2017-12-02: 160.6 mg via INTRAVENOUS

## 2017-12-02 MED ORDER — OXYCODONE HCL 5 MG PO TABS
5.0000 mg | ORAL_TABLET | ORAL | Status: DC | PRN
  Administered 2017-12-02: 5 mg via ORAL

## 2017-12-02 MED ORDER — DOCUSATE SODIUM 100 MG PO CAPS: 100.0000 mg | ORAL_CAPSULE | Freq: Two times a day (BID) | ORAL | 0 refills | Status: AC

## 2017-12-02 MED ORDER — ACETAMINOPHEN 10 MG/ML IV SOLN
INTRAVENOUS | Status: AC
  Filled 2017-12-02: qty 100

## 2017-12-02 MED ORDER — BUPIVACAINE-EPINEPHRINE 0.25% -1:200000 IJ SOLN
INTRAMUSCULAR | Status: DC | PRN
  Administered 2017-12-02: 8 mL
  Administered 2017-12-02: 10 mL

## 2017-12-02 MED ORDER — FENTANYL CITRATE (PF) 100 MCG/2ML IJ SOLN: 25.0000 ug | INTRAMUSCULAR | Status: DC | PRN

## 2017-12-02 MED ORDER — FENTANYL CITRATE (PF) 100 MCG/2ML IJ SOLN
INTRAMUSCULAR | Status: DC | PRN
  Administered 2017-12-02 (×4): 50 ug via INTRAVENOUS
  Administered 2017-12-02: 100 ug via INTRAVENOUS
  Administered 2017-12-02: 50 ug via INTRAVENOUS

## 2017-12-02 MED ORDER — MEPERIDINE HCL 50 MG/ML IJ SOLN: 6.2500 mg | INTRAMUSCULAR | Status: DC | PRN

## 2017-12-02 MED ORDER — HYDROMORPHONE HCL 2 MG/ML IJ SOLN
0.2500 mg | INTRAMUSCULAR | Status: DC | PRN
  Administered 2017-12-02 (×2): 0.5 mg via INTRAVENOUS

## 2017-12-02 MED ORDER — ROCURONIUM BROMIDE 100 MG/10ML IV SOLN
INTRAVENOUS | Status: DC | PRN
  Administered 2017-12-02: 10 mg via INTRAVENOUS
  Administered 2017-12-02: 40 mg via INTRAVENOUS
  Administered 2017-12-02 (×2): 10 mg via INTRAVENOUS

## 2017-12-02 MED ORDER — LACTATED RINGERS IV SOLN
INTRAVENOUS | Status: DC
  Administered 2017-12-02: 50 mL/h via INTRAVENOUS
  Administered 2017-12-02 (×2): via INTRAVENOUS

## 2017-12-02 MED ORDER — ACETAMINOPHEN 500 MG PO TABS
1000.0000 mg | ORAL_TABLET | ORAL | Status: AC
  Administered 2017-12-02: 1000 mg via ORAL
  Filled 2017-12-02: qty 2

## 2017-12-02 MED ORDER — CELECOXIB 200 MG PO CAPS
200.0000 mg | ORAL_CAPSULE | ORAL | Status: AC
  Administered 2017-12-02: 200 mg via ORAL
  Filled 2017-12-02: qty 1

## 2017-12-02 MED ORDER — FENTANYL CITRATE (PF) 250 MCG/5ML IJ SOLN
INTRAMUSCULAR | Status: AC
  Filled 2017-12-02: qty 5

## 2017-12-02 MED ORDER — LIDOCAINE 2% (20 MG/ML) 5 ML SYRINGE
INTRAMUSCULAR | Status: DC | PRN
  Administered 2017-12-02: 100 mg via INTRAVENOUS

## 2017-12-02 MED ORDER — SODIUM CHLORIDE 0.9% FLUSH: 3.0000 mL | Freq: Two times a day (BID) | INTRAVENOUS | Status: DC

## 2017-12-02 MED ORDER — MIDAZOLAM HCL 2 MG/2ML IJ SOLN
INTRAMUSCULAR | Status: AC
  Filled 2017-12-02: qty 2

## 2017-12-02 MED ORDER — HYDROCODONE-ACETAMINOPHEN 7.5-325 MG PO TABS: 1.0000 | ORAL_TABLET | Freq: Once | ORAL | Status: DC | PRN

## 2017-12-02 MED ORDER — ACETAMINOPHEN 650 MG RE SUPP: 650.0000 mg | RECTAL | Status: DC | PRN

## 2017-12-02 MED ORDER — SODIUM CHLORIDE 0.9 % IV SOLN: 250.0000 mL | INTRAVENOUS | Status: DC | PRN

## 2017-12-02 MED ORDER — HYDROMORPHONE HCL 2 MG/ML IJ SOLN
INTRAMUSCULAR | Status: AC
  Administered 2017-12-02: 0.5 mg via INTRAVENOUS
  Filled 2017-12-02: qty 1

## 2017-12-02 MED ORDER — CEFAZOLIN SODIUM-DEXTROSE 2-4 GM/100ML-% IV SOLN
INTRAVENOUS | Status: AC
  Filled 2017-12-02: qty 100

## 2017-12-02 MED ORDER — ACETAMINOPHEN 10 MG/ML IV SOLN
1000.0000 mg | Freq: Once | INTRAVENOUS | Status: DC | PRN
  Administered 2017-12-02: 1000 mg via INTRAVENOUS

## 2017-12-02 MED ORDER — GABAPENTIN 300 MG PO CAPS
300.0000 mg | ORAL_CAPSULE | ORAL | Status: AC
  Administered 2017-12-02: 300 mg via ORAL
  Filled 2017-12-02: qty 1

## 2017-12-02 MED ORDER — PROMETHAZINE HCL 25 MG/ML IJ SOLN: 6.2500 mg | INTRAMUSCULAR | Status: DC | PRN

## 2017-12-02 MED ORDER — SODIUM CHLORIDE 0.9% FLUSH: 3.0000 mL | INTRAVENOUS | Status: DC | PRN

## 2017-12-02 MED ORDER — 0.9 % SODIUM CHLORIDE (POUR BTL) OPTIME
TOPICAL | Status: DC | PRN
  Administered 2017-12-02: 1000 mL

## 2017-12-02 MED ORDER — EPHEDRINE SULFATE-NACL 50-0.9 MG/10ML-% IV SOSY
PREFILLED_SYRINGE | INTRAVENOUS | Status: DC | PRN
  Administered 2017-12-02: 10 mg via INTRAVENOUS

## 2017-12-02 MED ORDER — BUPIVACAINE-EPINEPHRINE (PF) 0.25% -1:200000 IJ SOLN
INTRAMUSCULAR | Status: AC
  Filled 2017-12-02: qty 30

## 2017-12-02 MED ORDER — FENTANYL CITRATE (PF) 100 MCG/2ML IJ SOLN
INTRAMUSCULAR | Status: AC
  Filled 2017-12-02: qty 2

## 2017-12-02 MED ORDER — OXYCODONE-ACETAMINOPHEN 5-325 MG PO TABS: 1.0000 | ORAL_TABLET | Freq: Four times a day (QID) | ORAL | 0 refills | Status: DC | PRN

## 2017-12-02 MED ORDER — PROPOFOL 10 MG/ML IV BOLUS
INTRAVENOUS | Status: AC
  Filled 2017-12-02: qty 20

## 2017-12-02 MED ORDER — CEFAZOLIN SODIUM-DEXTROSE 2-3 GM-%(50ML) IV SOLR
INTRAVENOUS | Status: DC | PRN
  Administered 2017-12-02: 2 g via INTRAVENOUS

## 2017-12-02 MED ORDER — MIDAZOLAM HCL 2 MG/2ML IJ SOLN
INTRAMUSCULAR | Status: DC | PRN
  Administered 2017-12-02: 2 mg via INTRAVENOUS

## 2017-12-02 SURGICAL SUPPLY — 37 items
APPLIER CLIP LOGIC TI 5 (MISCELLANEOUS) IMPLANT
BLADE SURG CLIPPER 3M 9600 (MISCELLANEOUS) ×3 IMPLANT
CHLORAPREP W/TINT 26ML (MISCELLANEOUS) ×3 IMPLANT
COVER SURGICAL LIGHT HANDLE (MISCELLANEOUS) ×3 IMPLANT
DERMABOND ADVANCED (GAUZE/BANDAGES/DRESSINGS) ×2
DERMABOND ADVANCED .7 DNX12 (GAUZE/BANDAGES/DRESSINGS) ×1 IMPLANT
DEVICE PMI PUNCTURE CLOSURE (MISCELLANEOUS) ×3 IMPLANT
DEVICE SECURE STRAP 25 ABSORB (INSTRUMENTS) ×3 IMPLANT
ELECT REM PT RETURN 9FT ADLT (ELECTROSURGICAL) ×3
ELECTRODE REM PT RTRN 9FT ADLT (ELECTROSURGICAL) ×1 IMPLANT
GLOVE BIO SURGEON STRL SZ 6 (GLOVE) ×6 IMPLANT
GLOVE BIOGEL PI IND STRL 6.5 (GLOVE) ×2 IMPLANT
GLOVE BIOGEL PI IND STRL 7.0 (GLOVE) ×2 IMPLANT
GLOVE BIOGEL PI IND STRL 7.5 (GLOVE) ×1 IMPLANT
GLOVE BIOGEL PI INDICATOR 6.5 (GLOVE) ×4
GLOVE BIOGEL PI INDICATOR 7.0 (GLOVE) ×4
GLOVE BIOGEL PI INDICATOR 7.5 (GLOVE) ×2
GLOVE ECLIPSE 6.0 STRL STRAW (GLOVE) ×3 IMPLANT
GLOVE ECLIPSE 7.5 STRL STRAW (GLOVE) ×3 IMPLANT
GOWN STRL REUS W/ TWL LRG LVL3 (GOWN DISPOSABLE) ×3 IMPLANT
GOWN STRL REUS W/TWL LRG LVL3 (GOWN DISPOSABLE) ×6
KIT BASIN OR (CUSTOM PROCEDURE TRAY) ×3 IMPLANT
KIT TURNOVER KIT B (KITS) ×3 IMPLANT
MESH ULTRAPRO 3X6 7.6X15CM (Mesh General) ×3 IMPLANT
NEEDLE INSUFFLATION 14GA 120MM (NEEDLE) ×6 IMPLANT
NS IRRIG 1000ML POUR BTL (IV SOLUTION) ×3 IMPLANT
PAD ARMBOARD 7.5X6 YLW CONV (MISCELLANEOUS) ×6 IMPLANT
SCISSORS LAP 5X35 DISP (ENDOMECHANICALS) ×3 IMPLANT
SLEEVE ENDOPATH XCEL 5M (ENDOMECHANICALS) ×3 IMPLANT
SUT MNCRL AB 4-0 PS2 18 (SUTURE) ×3 IMPLANT
TOWEL OR 17X24 6PK STRL BLUE (TOWEL DISPOSABLE) ×3 IMPLANT
TRAY FOLEY CATH SILVER 16FR (SET/KITS/TRAYS/PACK) ×3 IMPLANT
TRAY LAPAROSCOPIC MC (CUSTOM PROCEDURE TRAY) ×3 IMPLANT
TROCAR XCEL 12X100 BLDLESS (ENDOMECHANICALS) ×3 IMPLANT
TROCAR XCEL NON-BLD 5MMX100MML (ENDOMECHANICALS) ×3 IMPLANT
TUBING INSUFFLATION (TUBING) ×3 IMPLANT
WATER STERILE IRR 1000ML POUR (IV SOLUTION) ×3 IMPLANT

## 2017-12-02 NOTE — Anesthesia Preprocedure Evaluation (Addendum)
Anesthesia Evaluation  Patient identified by MRN, date of birth, ID band Patient awake    Reviewed: Allergy & Precautions, NPO status , Patient's Chart, lab work & pertinent test results  Airway Mallampati: II  TM Distance: >3 FB Neck ROM: Full    Dental no notable dental hx. (+) Teeth Intact, Dental Advisory Given   Pulmonary neg pulmonary ROS,    Pulmonary exam normal breath sounds clear to auscultation       Cardiovascular hypertension, Pt. on medications Normal cardiovascular exam Rhythm:Regular Rate:Normal     Neuro/Psych negative neurological ROS  negative psych ROS   GI/Hepatic negative GI ROS, Neg liver ROS,   Endo/Other    Renal/GU negative Renal ROS  negative genitourinary   Musculoskeletal   Abdominal   Peds negative pediatric ROS (+)  Hematology negative hematology ROS (+)   Anesthesia Other Findings ALL: Amoxicillin  Reproductive/Obstetrics                            ,lastrenal  Lab Results  Component Value Date   WBC 8.2 11/24/2017   HGB 14.8 11/24/2017   HCT 42.6 11/24/2017   MCV 88.2 11/24/2017   PLT 192 11/24/2017    Anesthesia Physical Anesthesia Plan  ASA: II  Anesthesia Plan: General   Post-op Pain Management:    Induction: Intravenous  PONV Risk Score and Plan: Treatment may vary due to age or medical condition, Ondansetron and Dexamethasone  Airway Management Planned: Oral ETT  Additional Equipment:   Intra-op Plan:   Post-operative Plan: Extubation in OR  Informed Consent: I have reviewed the patients History and Physical, chart, labs and discussed the procedure including the risks, benefits and alternatives for the proposed anesthesia with the patient or authorized representative who has indicated his/her understanding and acceptance.   Dental advisory given  Plan Discussed with: CRNA  Anesthesia Plan Comments:         Anesthesia  Quick Evaluation

## 2017-12-02 NOTE — Anesthesia Procedure Notes (Signed)
Procedure Name: Intubation Date/Time: 12/02/2017 10:29 AM Performed by: Leonor Liv, CRNA Pre-anesthesia Checklist: Patient identified, Emergency Drugs available, Suction available and Patient being monitored Patient Re-evaluated:Patient Re-evaluated prior to induction Oxygen Delivery Method: Circle System Utilized Preoxygenation: Pre-oxygenation with 100% oxygen Induction Type: IV induction Ventilation: Mask ventilation without difficulty Laryngoscope Size: Mac and 4 Grade View: Grade I Tube type: Oral Tube size: 7.5 mm Number of attempts: 1 Airway Equipment and Method: Stylet and Oral airway Placement Confirmation: ETT inserted through vocal cords under direct vision,  positive ETCO2 and breath sounds checked- equal and bilateral Secured at: 22 cm Tube secured with: Tape Dental Injury: Teeth and Oropharynx as per pre-operative assessment

## 2017-12-02 NOTE — Interval H&P Note (Signed)
History and Physical Interval Note:  12/02/2017 9:54 AM  Brent Odom  has presented today for surgery, with the diagnosis of RIGHT INGUINAL HERNIA  The various methods of treatment have been discussed with the patient and family. After consideration of risks, benefits and other options for treatment, the patient has consented to  Procedure(s): LAPAROSCOPIC RIGHT INGUINAL HERNIA REPAIR WITH MESH (Right) as a surgical intervention .  The patient's history has been reviewed, patient examined, no change in status, stable for surgery.  I have reviewed the patient's chart and labs.  Questions were answered to the patient's satisfaction.     Chelsea Rich Brave

## 2017-12-02 NOTE — H&P (Signed)
Brent Odom Patient #: [id number] DOB: 06/22/1961 Married / Language: English / Race: White Male  History of Present Illness Patient words: 57 year old male referred by Dr. Marisue Humble at [facility] to evaluate right inguinal hernia. The patient noticed a lump in his right groin about 6 weeks ago associated with sharp pain when he was exercising/ lifting weights. About 3 weeks later he was lifting weights again and felt the same searing pain but did not have any visible abnormalities that time. A couple weeks later he then noticed a visible bulge in his right upper inguinal area, and this has persisted. He does not know if he can reduce it. He has since restricted his weight lifting, and now does thousand reps the day of later weights. No nausea or vomiting, no change in bowel habits or urinary complaints. He continues to experience significant pain in the area with any activities including getting in and out of his car, gardening, etc.  At that visit he also complained of dysphagia and subsequently had an upper GI that does show a small hiatal hernia with mild GERD, mild benign stricture at GE junction which did not impede passage of the barium tablet.  His medical history is notable for hypertension, hypercholesterolemia, restless leg syndrome, kidney stones, hemochromatosis, migraine, CKD stage III. Has not required any phlebotomy for the hemochromatosis in about 3 years.  No prior surgeries.  He is a nonsmoker, works from home, IT sales professional and agents. His wife who is a Marine scientist is here with him today.    Past Surgical History Shoulder Surgery Right. Tonsillectomy   Allergies  Penicillins  Medication History  Omeprazole (20MG  Capsule DR, Oral) Active. Acyclovir (200MG  Capsule, Oral) Active. Claritin (10MG  Capsule, Oral) Active. Fish Oil (1000MG  Capsule, Oral) Active. Lisinopril (5MG  Tablet, Oral) Active. Lutein-Zeaxanthin (25-5MG  Capsule,  Oral) Active. Multivitamin Adults (Oral) Active. Turmeric (500MG  Tablet, Oral) Active. Vitamin D3 (2000UNIT Tablet, Oral) Active. Flonase (50MCG/DOSE Inhaler, Nasal) Active. Atorvastatin Calcium (10MG  Tablet, Oral) Active. Saw Palmetto (Oral) Specific strength unknown - Active. Medications Reconciled  Social History  Alcohol use Moderate alcohol use. Caffeine use Coffee. No drug use Tobacco use Never smoker.  Family History  Alcohol Abuse Father, Sister. Heart Disease Father. Heart disease in male family member before age 20 Hypertension Father. Melanoma Father. Respiratory Condition Father.  Other Problems Gastroesophageal Reflux Disease High blood pressure Hypercholesterolemia Inguinal Hernia Kidney Stone Other disease, cancer, significant illness     Review of Systems  General Not Present- Appetite Loss, Chills, Fatigue, Fever, Night Sweats, Weight Gain and Weight Loss. Skin Not Present- Change in Wart/Mole, Dryness, Hives, Jaundice, New Lesions, Non-Healing Wounds, Rash and Ulcer. HEENT Present- Seasonal Allergies and Wears glasses/contact lenses. Not Present- Earache, Hearing Loss, Hoarseness, Nose Bleed, Oral Ulcers, Ringing in the Ears, Sinus Pain, Sore Throat, Visual Disturbances and Yellow Eyes. Respiratory Not Present- Bloody sputum, Chronic Cough, Difficulty Breathing, Snoring and Wheezing. Breast Not Present- Breast Mass, Breast Pain, Nipple Discharge and Skin Changes. Cardiovascular Not Present- Chest Pain, Difficulty Breathing Lying Down, Leg Cramps, Palpitations, Rapid Heart Rate, Shortness of Breath and Swelling of Extremities. Gastrointestinal Present- Abdominal Pain. Not Present- Bloating, Bloody Stool, Change in Bowel Habits, Chronic diarrhea, Constipation, Difficulty Swallowing, Excessive gas, Gets full quickly at meals, Hemorrhoids, Indigestion, Nausea, Rectal Pain and Vomiting. Male Genitourinary Not Present- Blood in  Urine, Change in Urinary Stream, Frequency, Impotence, Nocturia, Painful Urination, Urgency and Urine Leakage. Musculoskeletal Present- Swelling of Extremities. Not Present- Back Pain, Joint Pain, Joint Stiffness, Muscle Pain and  Muscle Weakness. Neurological Not Present- Decreased Memory, Fainting, Headaches, Numbness, Seizures, Tingling, Tremor, Trouble walking and Weakness. Psychiatric Not Present- Anxiety, Bipolar, Change in Sleep Pattern, Depression, Fearful and Frequent crying. Endocrine Not Present- Cold Intolerance, Excessive Hunger, Hair Changes, Heat Intolerance, Hot flashes and New Diabetes. Hematology Not Present- Blood Thinners, Easy Bruising, Excessive bleeding, Gland problems, HIV and Persistent Infections.  Vitals  Weight: 178.38 lb Height: 69in Body Surface Area: 1.97 m Body Mass Index: 26.34 kg/m  Temp.: 98.60F(Oral)  Pulse: 69 (Regular)  BP: 122/92 (Sitting, Left Arm, Standard)      Physical Exam   The physical exam findings are as follows: Note:Gen: alert and well appearing Eye: extraocular motion intact, no scleral icterus ENT: moist mucus membranes, dentition intact Neck: no mass or thyromegaly Chest: unlabored respirations, symmetrical air entry, clear bilaterally CV: regular rate and rhythm, no pedal edema Abdomen: soft, nontender, nondistended. No mass or organomegaly. Reducible right inguinal hernia, no hernia on the left or the umbilicus. MSK: strength symmetrical throughout, no deformity Neuro: grossly intact, normal gait Psych: normal mood and affect, appropriate insight Skin: warm and dry, no rash or lesion on limited exam    Assessment & Plan   RIGHT INGUINAL HERNIA (K40.90) Story: Reducible, but symptomatic with significant pain. I recommended repair and we discussed options of open versus laparoscopic including the risks and benefits of each. He is very concerned about getting back to exercising and working as soon as  possible. I therefore recommended laparoscopic approach. We discussed the technique of the operation, the use of mesh, the risks of bleeding, infection, pain, scarring, intra-abdominal injury or injury to the pelvic structures. Discussed the risk of hernia recurrence. Discussed the six-week no lifting restriction. Questions were welcomed and answered. We will schedule for surgery.

## 2017-12-02 NOTE — Transfer of Care (Signed)
Immediate Anesthesia Transfer of Care Note  Patient: Brent Odom  Procedure(s) Performed: LAPAROSCOPIC RIGHT INGUINAL HERNIA REPAIR WITH MESH (Right Inguinal)  Patient Location: PACU  Anesthesia Type:General  Level of Consciousness: awake, alert , oriented and patient cooperative  Airway & Oxygen Therapy: Patient Spontanous Breathing and Patient connected to nasal cannula oxygen  Post-op Assessment: Report given to RN and Post -op Vital signs reviewed and stable  Post vital signs: Reviewed and stable  Last Vitals:  Vitals Value Taken Time  BP 138/81 12/02/2017 12:10 PM  Temp    Pulse 88 12/02/2017 12:14 PM  Resp 19 12/02/2017 12:14 PM  SpO2 100 % 12/02/2017 12:14 PM  Vitals shown include unvalidated device data.  Last Pain:  Vitals:   12/02/17 0937  TempSrc:   PainSc: 0-No pain      Patients Stated Pain Goal: 3 (35/36/14 4315)  Complications: No apparent anesthesia complications

## 2017-12-02 NOTE — Discharge Instructions (Signed)
HERNIA REPAIR: POST OP INSTRUCTIONS  ######################################################################  EAT Gradually transition to a high fiber diet with a fiber supplement over the next few weeks after discharge.  Start with a pureed / full liquid diet (see below)  WALK Walk an hour a day.  Control your pain to do that.    CONTROL PAIN Control pain so that you can walk, sleep, tolerate sneezing/coughing, go up/down stairs.  HAVE A BOWEL MOVEMENT DAILY Keep your bowels regular to avoid problems.  OK to try a laxative to override constipation.  OK to use an antidairrheal to slow down diarrhea.  Call if not better after 2 tries  CALL IF YOU HAVE PROBLEMS/CONCERNS Call if you are still struggling despite following these instructions. Call if you have concerns not answered by these instructions  ######################################################################    1. DIET: Follow a light bland diet the first 24 hours after arrival home, such as soup, liquids, crackers, etc.  Be sure to include lots of fluids daily.  Avoid fast food or heavy meals as your are more likely to get nauseated.  Eat a low fat the next few days after surgery. 2. Take your usually prescribed home medications unless otherwise directed. 3. PAIN CONTROL: a. Pain is best controlled by a usual combination of three different methods TOGETHER: i. Ice/Heat ii. Over the counter pain medication iii. Prescription pain medication b. Most patients will experience some swelling and bruising around the hernia(s) such as the bellybutton, groins, or old incisions.  Ice packs or heating pads (30-60 minutes up to 6 times a day) will help. Use ice for the first few days to help decrease swelling and bruising, then switch to heat to help relax tight/sore spots and speed recovery.  Some people prefer to use ice alone, heat alone, alternating between ice & heat.  Experiment to what works for you.  Swelling and  bruising can take several weeks to resolve.   c. It is helpful to take an over-the-counter pain medication regularly for the first few weeks.  Choose one of the following that works best for you: i. Naproxen (Aleve, etc)  Two 220mg  tabs twice a day ii. Ibuprofen (Advil, etc) Three 200mg  tabs four times a day (every meal & bedtime) iii. Acetaminophen (Tylenol, etc) 325-650mg  four times a day (every meal & bedtime) d. A  prescription for pain medication should be given to you upon discharge.  Take your pain medication as prescribed.  i. If you are having problems/concerns with the prescription medicine (does not control pain, nausea, vomiting, rash, itching, etc), please call us [phone] to see if we need to switch you to a different pain medicine that will work better for you and/or control your side effect better. ii. If you need a refill on your pain medication, please contact your pharmacy.  They will contact our office to request authorization. Prescriptions will not be filled after 5 pm or on week-ends. 4. Avoid getting constipated.  Between the surgery and the pain medications, it is common to experience some constipation.  Increasing fluid intake and taking a fiber supplement (such as Metamucil, Citrucel, FiberCon, MiraLax, etc) 1-2 times a day regularly will usually help prevent this problem from occurring.  A mild laxative (prune juice, Milk of Magnesia, MiraLax, etc) should be taken according to package directions if there are no bowel movements after 48 hours.   5. Wash / shower every day.  You may shower over the skin glue which is waterproof.  No rubbing, scrubbing,  lotions or ointments to incisions. 6. Skin glue will flake off after about 2 weeks. You may leave the incision open to air.  You may replace a dressing/Band-Aid to cover the incision for comfort if you wish.  Continue to shower over incision(s) after the dressing is off.  7. ACTIVITIES as tolerated:   a. You may resume  regular (light) daily activities beginning the next day--such as daily self-care, walking, climbing stairs--gradually increasing activities as tolerated.  If you can walk 30 minutes without difficulty, it is safe to try more intense activity such as jogging, treadmill, bicycling, low-impact aerobics, swimming, etc. b. Refrain from the most intensive and strenuous activity such as sit-ups, heavy lifting, contact sports, etc  Refrain from any heavy lifting or straining until 6 weeks after surgery.   c. DO NOT PUSH THROUGH PAIN.  Let pain be your guide: If it hurts to do something, don't do it.  Pain is your body warning you to avoid that activity for another week until the pain goes down. d. You may drive when you are no longer taking prescription pain medication, you can comfortably wear a seatbelt, and you can safely maneuver your car and apply brakes. e. Dennis Bast may have sexual intercourse when it is comfortable.  8. FOLLOW UP in our office a. Please call [facility] at [phone] to set up an appointment to see your surgeon in the office for a follow-up appointment approximately 2-3 weeks after your surgery. b. Make sure that you call for this appointment the day you arrive home to insure a convenient appointment time. 9.         IF YOU HAVE DISABILITY OR FAMILY LEAVE FORMS, BRING THEM TO THE OFFICE FOR PROCESSING.  DO NOT GIVE THEM TO YOUR DOCTOR.  WHEN TO CALL us [phone]: 1. Poor pain control 2. Reactions / problems with new medications (rash/itching, nausea, etc)  3. Fever over 101.5 F (38.5 C) 4. Inability to urinate 5. Nausea and/or vomiting 6. Worsening swelling or bruising 7. Continued bleeding from incision. 8. Increased pain, redness, or drainage from the incision              The clinic staff is available to answer your questions during regular business hours (8:30am-5pm).  Please dont hesitate to call and ask to speak to one of our nurses for clinical concerns.              If you  have a medical emergency, go to the nearest emergency room or call 911.             A surgeon from [facility] is always on call at the hospitals in [facility], [place], [place], [place], [place]  [address] ?  [address] [address], [place], [place]   [address] MAIN: [phone] ? TOLL FREE: [phone] ? FAX: [phone] [email or web address]

## 2017-12-02 NOTE — Op Note (Signed)
Operative Note  QUAMIR WILLEMSEN  [id number]  [id number]  12/02/2017   Surgeon: Vikki Ports A ConnorMD  Assistant: OR staff  Procedure performed: laparoscopic (TAP) repair of indirect right inguinal hernia with mesh, excision of cord lipoma  Preop diagnosis: right inguinal hernia Post-op diagnosis/intraop findings: indirect right inguinal hernia, retroperitoneal lipoma  Specimens: no Retained items: no EBL: minimal cc Complications: none  Description of procedure: After obtaining informed consent the patient was taken to the operating room and placed supine on operating room table wheregeneral endotracheal anesthesia was initiated, preoperative antibiotics were administered, SCDs applied, foley inserted and a formal timeout was performed. The abdomen was prepped and draped in usual sterile fashion. Peritoneal access was gained using a infraumbilical Veress needle and insufflation to 15 mmHg ensued without incident. A 5 mm trocar and camera were inserted and gross inspection field no evidence of injury from our entry or gross of normality. The left side was inspected and there was no hernia present. The right side was inspected, there was confirmed to be an indirect hernia. Under direct visualization and after infiltration with local, a right-sided 5 mm trocar and a left-sided 12 mm trocar were inserted. Cautery and sharp dissection were used to develop a peritoneal flap from the anterior superior iliac spine to the medial umbilical ligament. The flap was developed inferiorly until the Cooper's ligament was exposed medially and there was sufficient room to place mesh. The hernia sac was densely adherent and very difficult to reduce but ultimately I was able to free it from the cord structures which were inspected and confirmed to be free of injury. There was a moderate sized retroperitoneal lipoma which was excised and discarded. A mild amount of bleeding from this was controlled with cautery  and direct pressure. A 3 x 6 ultra Pro mesh was brought onto the field and trimmed to approximate the site. This was inserted and placed over the hernia defect with sufficient overlap noted. This was secured to the Cooper's ligament and then superiorly on either side of the inferior epigastric vessels using the secure strap tacker. No tacks were placed inferiorly. The peritoneal flap was then brought back up to the cover the mesh, confirming no folding or buckling of the mesh in doing so. The peritoneum was reapproximated to the abdominal wall using the secure strap tacker. Upon completion there was no exposed mesh. Hemostasis was once again confirmed. A laparoscopic assisted taps block was performed on the right side. The 12 mm trocar site was then closed with 0 Vicryl in the fascia using the PMI device. The abdomen was then desufflated and all trochars removed. The skin incisions are closed with subcuticular Monocryl and Dermabond. The patient was then awakened, extubated and taken to PACU in stable condition.   All counts were correct at the completion of the case.

## 2017-12-04 NOTE — Anesthesia Postprocedure Evaluation (Signed)
Anesthesia Post Note  Patient: Brent Odom  Procedure(s) Performed: LAPAROSCOPIC RIGHT INGUINAL HERNIA REPAIR WITH MESH (Right Inguinal)     Patient location during evaluation: PACU Anesthesia Type: General Level of consciousness: awake and alert Pain management: pain level controlled Vital Signs Assessment: post-procedure vital signs reviewed and stable Respiratory status: spontaneous breathing, nonlabored ventilation, respiratory function stable and patient connected to nasal cannula oxygen Cardiovascular status: blood pressure returned to baseline and stable Postop Assessment: no apparent nausea or vomiting Anesthetic complications: no    Last Vitals:  Vitals:   12/02/17 1400 12/02/17 1401  BP:    Pulse: 65 (!) 58  Resp: (!) 9 (!) 9  Temp:    SpO2: 96% 95%    Last Pain:  Vitals:   12/02/17 1310  TempSrc:   PainSc: 4                  Barnet Glasgow

## 2017-12-05 ENCOUNTER — Encounter (HOSPITAL_COMMUNITY): Payer: Self-pay | Admitting: Surgery

## 2018-04-14 DIAGNOSIS — G5600 Carpal tunnel syndrome, unspecified upper limb: Secondary | ICD-10-CM | POA: Diagnosis not present

## 2018-04-14 DIAGNOSIS — G479 Sleep disorder, unspecified: Secondary | ICD-10-CM | POA: Diagnosis not present

## 2018-05-03 DIAGNOSIS — R5383 Other fatigue: Secondary | ICD-10-CM | POA: Diagnosis not present

## 2018-05-03 DIAGNOSIS — B079 Viral wart, unspecified: Secondary | ICD-10-CM | POA: Diagnosis not present

## 2018-05-03 DIAGNOSIS — B009 Herpesviral infection, unspecified: Secondary | ICD-10-CM | POA: Diagnosis not present

## 2018-05-03 DIAGNOSIS — I10 Essential (primary) hypertension: Secondary | ICD-10-CM | POA: Diagnosis not present

## 2018-05-03 DIAGNOSIS — Z125 Encounter for screening for malignant neoplasm of prostate: Secondary | ICD-10-CM | POA: Diagnosis not present

## 2018-05-03 DIAGNOSIS — E7800 Pure hypercholesterolemia, unspecified: Secondary | ICD-10-CM | POA: Diagnosis not present

## 2018-05-03 DIAGNOSIS — Z23 Encounter for immunization: Secondary | ICD-10-CM | POA: Diagnosis not present

## 2018-05-04 ENCOUNTER — Encounter (HOSPITAL_COMMUNITY): Payer: Self-pay

## 2018-05-04 ENCOUNTER — Emergency Department (HOSPITAL_COMMUNITY)
Admission: EM | Admit: 2018-05-04 | Discharge: 2018-05-05 | Disposition: A | Discharge status: Home / Self Care | Payer: 59 | Attending: Emergency Medicine | Admitting: Emergency Medicine

## 2018-05-04 DIAGNOSIS — R1032 Left lower quadrant pain: Secondary | ICD-10-CM | POA: Insufficient documentation

## 2018-05-04 DIAGNOSIS — R109 Unspecified abdominal pain: Secondary | ICD-10-CM | POA: Diagnosis not present

## 2018-05-04 DIAGNOSIS — I10 Essential (primary) hypertension: Secondary | ICD-10-CM | POA: Insufficient documentation

## 2018-05-04 DIAGNOSIS — Z79899 Other long term (current) drug therapy: Secondary | ICD-10-CM | POA: Diagnosis not present

## 2018-05-04 MED ORDER — ONDANSETRON 4 MG PO TBDP
4.0000 mg | ORAL_TABLET | Freq: Once | ORAL | Status: AC
  Administered 2018-05-04: 4 mg via ORAL
  Filled 2018-05-04: qty 1

## 2018-05-04 MED ORDER — FENTANYL CITRATE (PF) 100 MCG/2ML IJ SOLN
50.0000 ug | INTRAMUSCULAR | Status: DC | PRN
  Administered 2018-05-04: 50 ug via NASAL
  Filled 2018-05-04: qty 2

## 2018-05-04 NOTE — ED Triage Notes (Signed)
Pt states that this afternoon he began to have L sided flank pain, hesitation, vomiting. Hx of multiple stones

## 2018-05-05 LAB — I-STAT CHEM 8, ED
BUN: 19 mg/dL (ref 6–20)
Calcium, Ion: 1.13 mmol/L — ABNORMAL LOW (ref 1.15–1.40)
Chloride: 104 mmol/L (ref 98–111)
Creatinine, Ser: 2 mg/dL — ABNORMAL HIGH (ref 0.61–1.24)
Glucose, Bld: 132 mg/dL — ABNORMAL HIGH (ref 70–99)
HEMATOCRIT: 40 % (ref 39.0–52.0)
HEMOGLOBIN: 13.6 g/dL (ref 13.0–17.0)
POTASSIUM: 4.2 mmol/L (ref 3.5–5.1)
SODIUM: 136 mmol/L (ref 135–145)
TCO2: 22 mmol/L (ref 22–32)

## 2018-05-05 LAB — URINALYSIS, ROUTINE W REFLEX MICROSCOPIC
BILIRUBIN URINE: NEGATIVE
GLUCOSE, UA: NEGATIVE mg/dL
KETONES UR: NEGATIVE mg/dL
LEUKOCYTES UA: NEGATIVE
NITRITE: NEGATIVE
PROTEIN: NEGATIVE mg/dL
Specific Gravity, Urine: 1.008 (ref 1.005–1.030)
pH: 6 (ref 5.0–8.0)

## 2018-05-05 MED ORDER — KETOROLAC TROMETHAMINE 15 MG/ML IJ SOLN
INTRAMUSCULAR | Status: AC
  Filled 2018-05-05: qty 1

## 2018-05-05 MED ORDER — KETOROLAC TROMETHAMINE 15 MG/ML IJ SOLN
15.0000 mg | Freq: Once | INTRAMUSCULAR | Status: AC
  Administered 2018-05-05: 15 mg via INTRAMUSCULAR

## 2018-05-05 MED ORDER — OXYCODONE-ACETAMINOPHEN 5-325 MG PO TABS
2.0000 | ORAL_TABLET | Freq: Once | ORAL | Status: AC
  Administered 2018-05-05: 2 via ORAL
  Filled 2018-05-05: qty 2

## 2018-05-05 MED ORDER — OXYCODONE-ACETAMINOPHEN 5-325 MG PO TABS: 1.0000 | ORAL_TABLET | Freq: Four times a day (QID) | ORAL | 0 refills | Status: DC | PRN

## 2018-05-05 MED ORDER — ONDANSETRON 4 MG PO TBDP: 4.0000 mg | ORAL_TABLET | Freq: Three times a day (TID) | ORAL | 0 refills | Status: DC | PRN

## 2018-05-05 NOTE — ED Notes (Signed)
Patient verbalizes understanding of discharge instructions. Opportunity for questioning and answers were provided. Armband removed by staff, pt discharged from ED home via POV.  

## 2018-05-05 NOTE — Discharge Instructions (Signed)
We recommend Percocet as prescribed for management of pain.  You may take this with 600 mg ibuprofen every 6 hours.  Continue use of Zofran for management of nausea/vomiting.  Follow-up with urology regarding your symptoms.

## 2018-05-05 NOTE — ED Notes (Signed)
Pt reports a long standing hx of kidney stones. Pt reports he is having that similar pain again this date and knows his pain will only get worse.

## 2018-05-05 NOTE — ED Provider Notes (Signed)
[facility] EMERGENCY DEPARTMENT Provider Note   CSN: [id number] Arrival date & time: 05/04/18  2111     History   Chief Complaint Chief Complaint  Patient presents with  . Nephrolithiasis    HPI Brent Odom is a 57 y.o. male.   57 year old male with a history of kidney stones presents to the emergency department for evaluation of left-sided flank pain.  States that symptoms began at approximately 1430 yesterday afternoon.  States that pain gradually worsened throughout the day; it has remained constant.  Took some ibuprofen as well as Percocet, but had one episode of vomiting shortly after.  Also notes hesitation with voiding as well as darker, discolored urine.  Has not had any fevers, dysuria, bowel changes.  Notes that his pain feels similar to prior kidney stones.  Has been followed by [facility] in the past.  Denies ever requiring intervention for stone passage.     Past Medical History:  Diagnosis Date  . Arthritis   . History of kidney stones   . Hyperlipidemia   . Hypertension   . Inguinal hernia     Patient Active Problem List   Diagnosis Date Noted  . DYSPNEA ON EXERTION 03/10/2009  . HSV 03/07/2009  . HYPERCHOLESTEROLEMIA 03/07/2009  . 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 RESTLESS LEGS SYNDROME 03/07/2009  . HYPERTENSION 03/07/2009  . HEMOCHROMATOSIS 03/07/2009    Past Surgical History:  Procedure Laterality Date  . COLONOSCOPY    . INGUINAL HERNIA REPAIR Right 12/02/2017   Procedure: LAPAROSCOPIC RIGHT INGUINAL HERNIA REPAIR WITH MESH;  Surgeon: Clovis Riley, MD;  Location: [place];  Service: General;  Laterality: Right;  . LIVER BIOPSY     30+ years ago  . SHOULDER ARTHROSCOPY Right 2008        Home Medications    Prior to Admission medications   Medication Sig Start Date End Date Taking? Authorizing Provider  acyclovir (ZOVIRAX) 200 MG capsule Take 200 mg by mouth 5 (five) times daily as needed (for  cold sores).    [provider]  Cholecalciferol (VITAMIN D3) 2000 units TABS Take 2,000 Units by mouth daily.    [provider]  DOXYLAMINE SUCCINATE, SLEEP, PO Take 25 mg by mouth at bedtime as needed (for sleep.).    [provider]  fluticasone (FLONASE) 50 MCG/ACT nasal spray Place 1 spray into both nostrils daily as needed for allergies.    [provider]  ibuprofen (ADVIL,MOTRIN) 200 MG tablet Take 400 mg by mouth every 8 (eight) hours as needed (for pain.).    [provider]  lisinopril (PRINIVIL,ZESTRIL) 5 MG tablet Take 5 mg by mouth daily.    [provider]  loratadine (CLARITIN) 10 MG tablet Take 10 mg by mouth daily.    [provider]  LUTEIN PO Take 25 mg by mouth daily.    [provider]  Multiple Vitamin (MULTIVITAMIN WITH MINERALS) TABS tablet Take 1 tablet by mouth daily.    [provider]  Nutritional Supplements (CREATINE) 750 MG CAPS Take 2,250 mg by mouth daily.    [provider]  Omega-3 Fatty Acids (FISH OIL) 1000 MG CAPS Take 1,000 mg by mouth daily.    [provider]  omeprazole (PRILOSEC) 20 MG capsule Take 20 mg by mouth daily.    [provider]  ondansetron (ZOFRAN ODT) 4 MG disintegrating tablet Take 1 tablet (4 mg total) by mouth every 8 (eight) hours as needed  for nausea or vomiting. 05/05/18   Antonietta Breach, PA-C  oxyCODONE-acetaminophen (PERCOCET/ROXICET) 5-325 MG tablet Take 1-2 tablets by mouth every 6 (six) hours as needed for severe pain. 05/05/18   Antonietta Breach, PA-C  Safflower Oil (CLA) 1000 MG CAPS Take 1,000 mg by mouth 3 (three) times daily with meals.    [provider]  Saw Palmetto 450 MG CAPS Take 450 mg by mouth 2 (two) times daily.    [provider]  Turmeric 500 MG CAPS Take 500 mg by mouth daily.    [provider]    Family History No family history on file.  Social History Social History   Tobacco  Use  . Smoking status: Never Smoker  . Smokeless tobacco: Never Used  Substance Use Topics  . Alcohol use: Yes    Comment: glass of wine/beer nightly  . Drug use: Never     Allergies   Amoxicillin   Review of Systems Review of Systems Ten systems reviewed and are negative for acute change, except as noted in the HPI.    Physical Exam Updated Vital Signs BP 119/73   Pulse 87   Temp 98.5 F (36.9 C) (Oral)   Resp 16   SpO2 93%   Physical Exam  Constitutional: He is oriented to person, place, and time. He appears well-developed and well-nourished. No distress.  Nontoxic appearing and in NAD  HENT:  Head: Normocephalic and atraumatic.  Eyes: Conjunctivae and EOM are normal. No scleral icterus.  Neck: Normal range of motion.  Cardiovascular: Normal rate, regular rhythm and intact distal pulses.  Pulmonary/Chest: Effort normal. No respiratory distress.  Abdominal:  Nondistended, nontender abdomen.  Musculoskeletal: Normal range of motion.  Neurological: He is alert and oriented to person, place, and time. He exhibits normal muscle tone. Coordination normal.  GCS 15. Moving all extremities spontaneously.  Skin: Skin is warm and dry. No rash noted. He is not diaphoretic. No erythema. No pallor.  Psychiatric: He has a normal mood and affect. His behavior is normal.  Nursing note and vitals reviewed.    ED Treatments / Results  Labs (all labs ordered are listed, but only abnormal results are displayed) Labs Reviewed  I-STAT CHEM 8, ED - Abnormal; Notable for the following components:      Result Value   Creatinine, Ser 2.00 (*)    Glucose, Bld 132 (*)    Calcium, Ion 1.13 (*)    All other components within normal limits    EKG None  Radiology No results found.  Procedures Procedures (including critical care time)  Medications Ordered in ED Medications  ondansetron (ZOFRAN-ODT) disintegrating tablet 4 mg (4 mg Oral Given 05/04/18 2124)  ketorolac (TORADOL)  15 MG/ML injection 15 mg (15 mg Intramuscular Given 05/05/18 0133)  oxyCODONE-acetaminophen (PERCOCET/ROXICET) 5-325 MG per tablet 2 tablet (2 tablets Oral Given 05/05/18 0238)    6:23 AM Patient continues to be asymptomatic.  Denies any pain.  He expresses comfort with continued outpatient urology follow-up.   Initial Impression / Assessment and Plan / ED Course  I have reviewed the triage vital signs and the nursing notes.  Pertinent labs & imaging results that were available during my care of the patient were reviewed by me and considered in my medical decision making (see chart for details).      Patient c/o L flank pain onset at 1430 with hx of multiple prior kidney stones. Symptoms have improved following Toradol. Last CT from 2016 with multiple nonobstructing left  renal calculi. Creatinine is elevated from baseline, similar to prior presentation for stone. Have discussed imaging with patient, but feel it is reasonable for him to have this completed outpatient by [facility] if deemed necessary.  Will continue with outpatient pain control with Percocet and ibuprofen.  Return precautions discussed and provided.  Patient discharged in stable condition with no unaddressed concerns.   Final Clinical Impressions(s) / ED Diagnoses   Final diagnoses:  Left flank pain    ED Discharge Orders         Ordered    oxyCODONE-acetaminophen (PERCOCET/ROXICET) 5-325 MG tablet  Every 6 hours PRN     05/05/18 0625    ondansetron (ZOFRAN ODT) 4 MG disintegrating tablet  Every 8 hours PRN     05/05/18 0625           Antonietta Breach, PA-C 05/05/18 6168    Ripley Fraise, MD 05/06/18 [id number]

## 2018-05-05 NOTE — ED Notes (Signed)
Pt reminded about urine sample.  Pt unable to give one at this time

## 2018-05-08 DIAGNOSIS — N201 Calculus of ureter: Secondary | ICD-10-CM | POA: Diagnosis not present

## 2018-06-09 DIAGNOSIS — N201 Calculus of ureter: Secondary | ICD-10-CM | POA: Diagnosis not present

## 2018-06-26 DIAGNOSIS — N201 Calculus of ureter: Secondary | ICD-10-CM | POA: Diagnosis not present

## 2018-07-04 IMAGING — RF DG ESOPHAGUS
3 series · 14 of 14 positions shown · non-contrast
Comparison: None.

CLINICAL DATA: Dysphagia

EXAM:
ESOPHOGRAM / BARIUM SWALLOW / BARIUM TABLET STUDY
TECHNIQUE: Combined double contrast and single contrast examination performed
using effervescent crystals, thick barium liquid, and thin barium
liquid. The patient was observed with fluoroscopy swallowing a 13 mm
barium sulphate tablet.
FLUOROSCOPY TIME:  Fluoroscopy Time:  0 minutes 54 second
Radiation Exposure Index (if provided by the fluoroscopic device):
Number of Acquired Spot Images: 0

[Series 1: sequence · 4 of 29 frames shown (1 of 2)]
[frame 5/29]
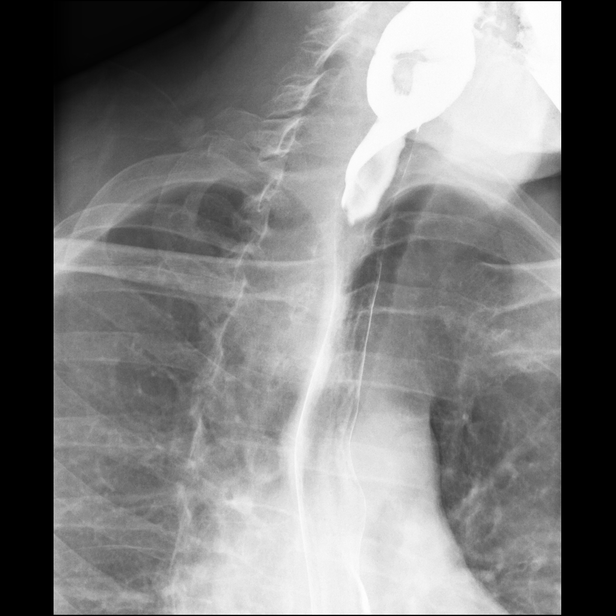
[frame 14/29]
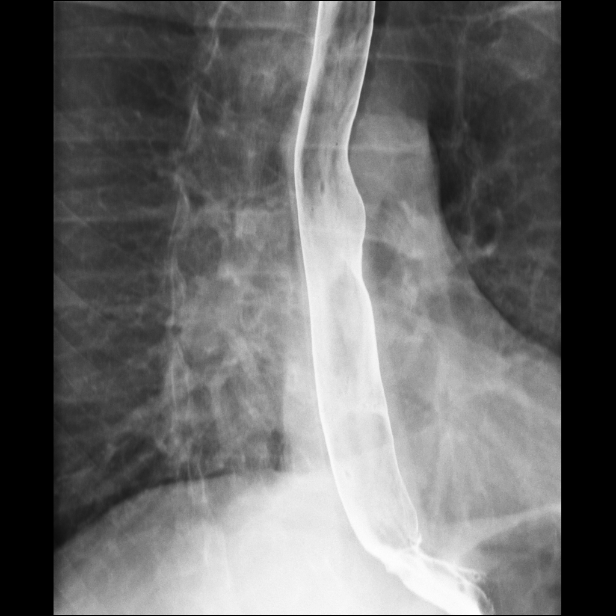
[frame 15/29]
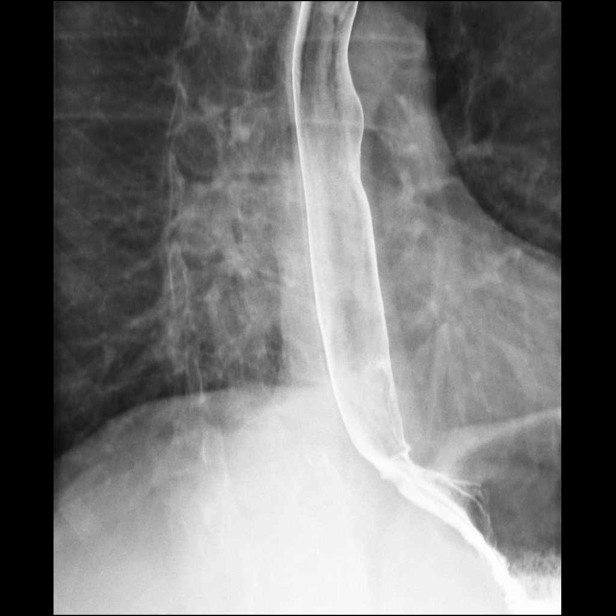
[frame 25/29]
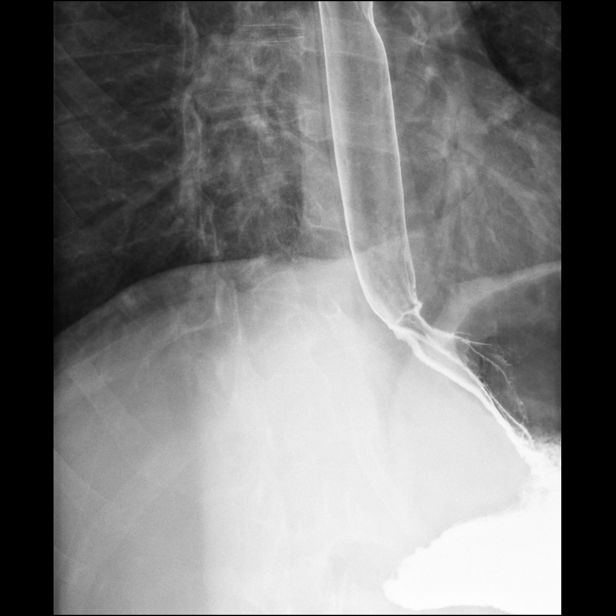

[Series 2: sequence · 4 of 40 frames shown (2 of 2)]
[frame 6/40]
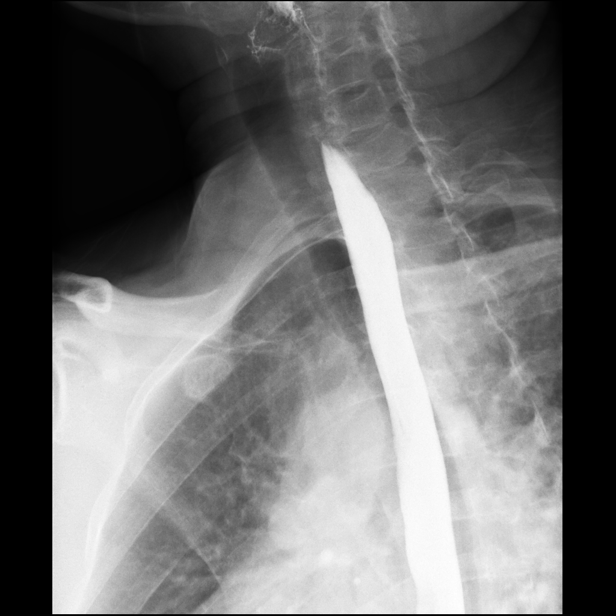
[frame 7/40]
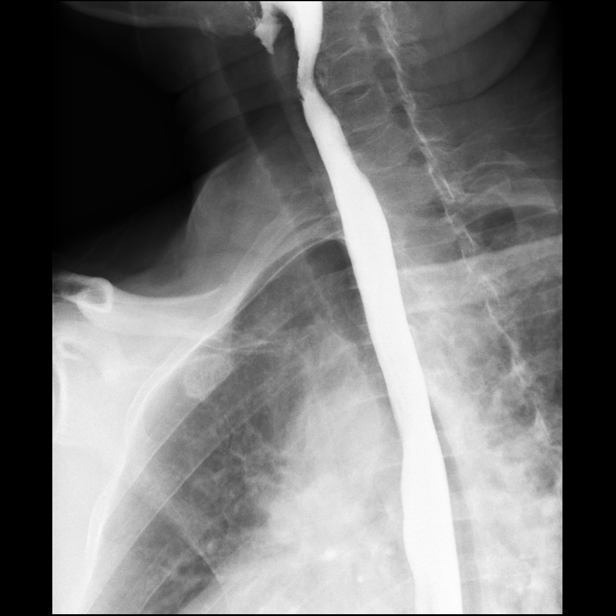
[frame 21/40]
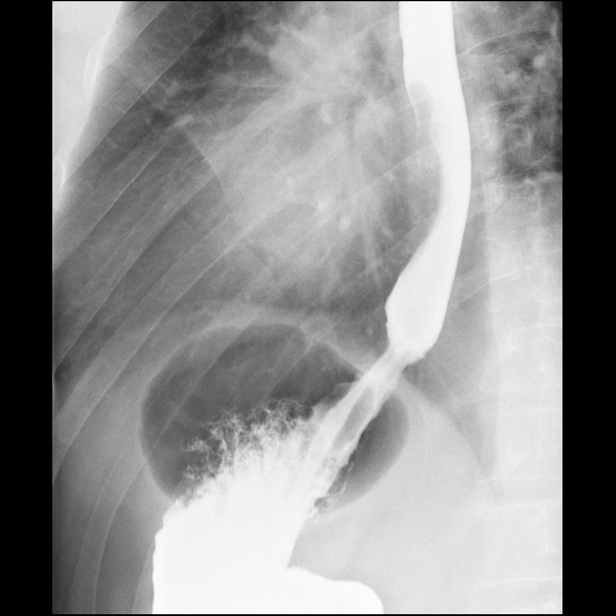
[frame 35/40]
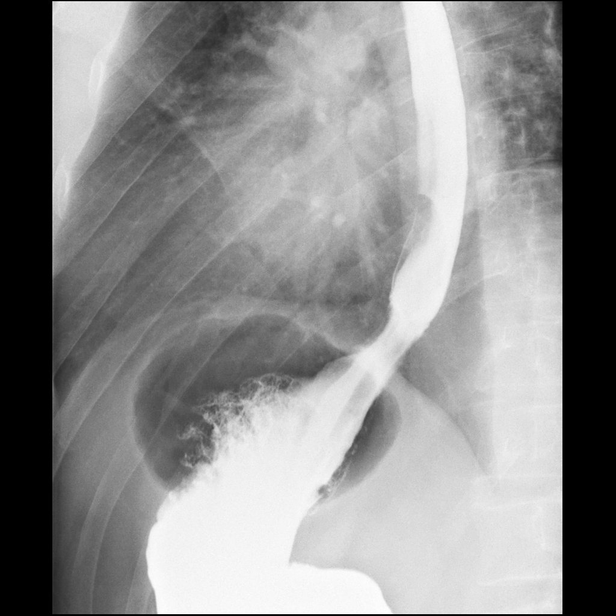

[Series 3: one shot · 6 of 6 slices shown]
[im 1/6]
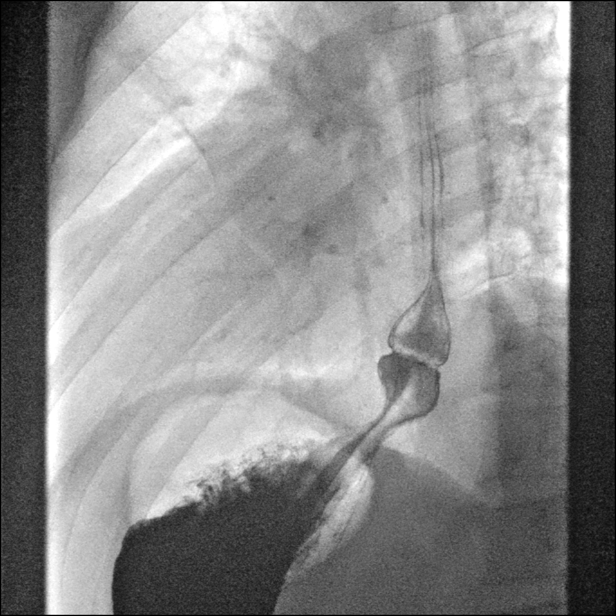
[im 2/6]
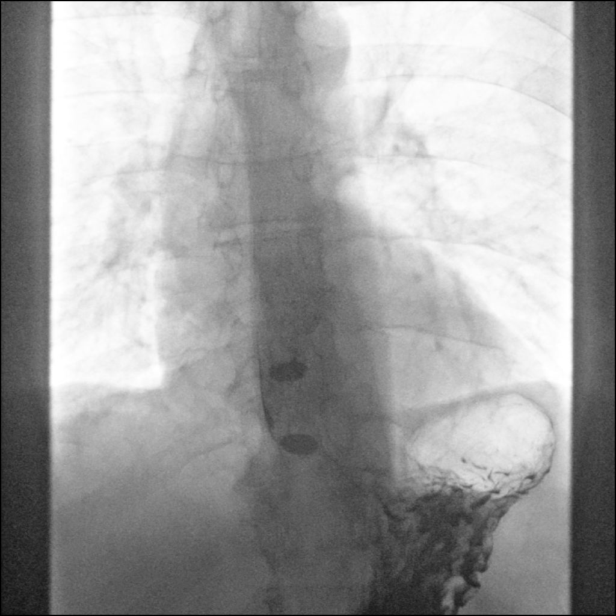
[im 3/6]
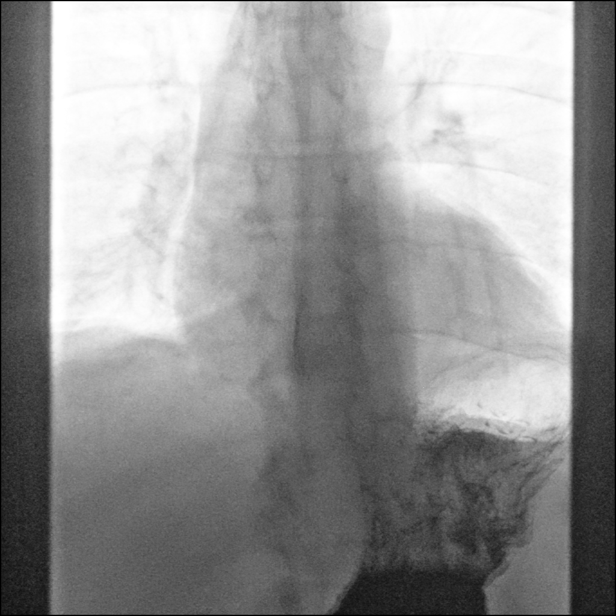
[im 4/6]
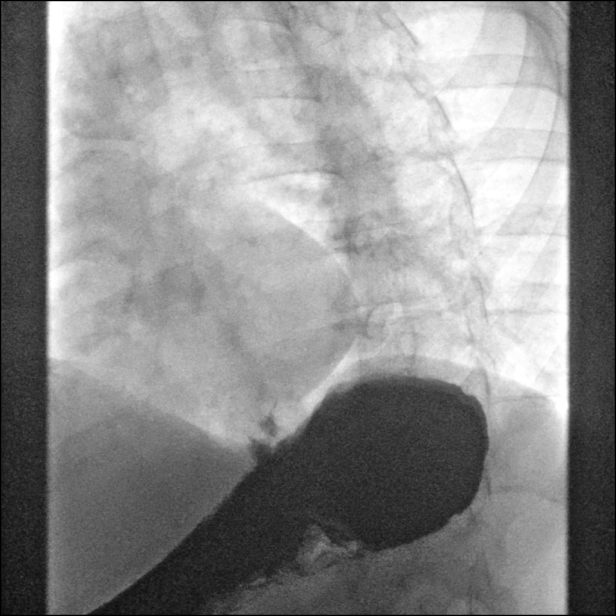
[im 5/6]
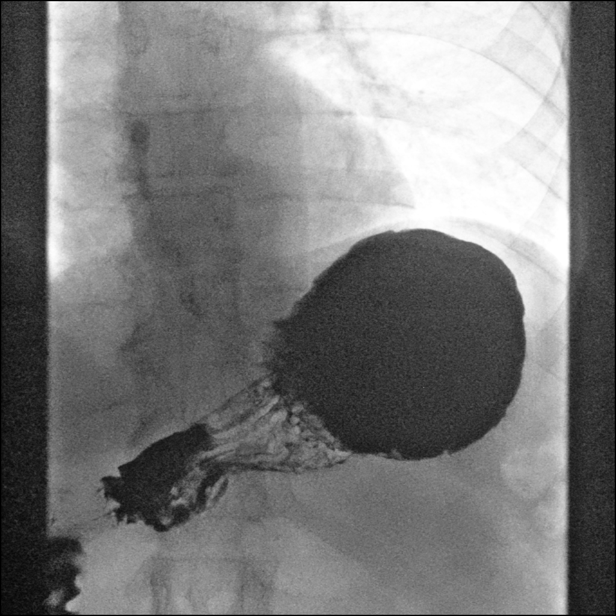
[im 6/6]
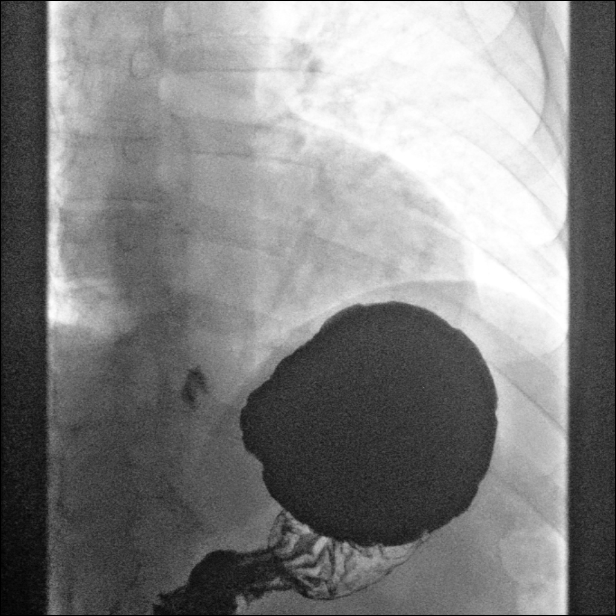

[14 of 14 positions shown; findings below may reference images not displayed]

FINDINGS: Esophageal mucosa and motility normal.

Small hiatal hernia with mild gastroesophageal reflux. Mild
stricture at the GE junction. Barium tablet passed readily into the
stomach without delay.

Mild gastroesophageal reflux while supine and drinking water.
IMPRESSION: Small hiatal hernia with mild gastroesophageal reflux.

Mild benign stricture GE junction which do not impede passage of the
barium tablet.

## 2018-08-17 DIAGNOSIS — L570 Actinic keratosis: Secondary | ICD-10-CM | POA: Diagnosis not present

## 2018-08-17 DIAGNOSIS — D234 Other benign neoplasm of skin of scalp and neck: Secondary | ICD-10-CM | POA: Diagnosis not present

## 2018-08-17 DIAGNOSIS — Z808 Family history of malignant neoplasm of other organs or systems: Secondary | ICD-10-CM | POA: Diagnosis not present

## 2018-08-17 DIAGNOSIS — D1801 Hemangioma of skin and subcutaneous tissue: Secondary | ICD-10-CM | POA: Diagnosis not present

## 2018-08-17 DIAGNOSIS — I781 Nevus, non-neoplastic: Secondary | ICD-10-CM | POA: Diagnosis not present

## 2018-12-06 DIAGNOSIS — J069 Acute upper respiratory infection, unspecified: Secondary | ICD-10-CM | POA: Diagnosis not present

## 2019-05-09 DIAGNOSIS — Z23 Encounter for immunization: Secondary | ICD-10-CM | POA: Diagnosis not present

## 2019-05-09 DIAGNOSIS — E7800 Pure hypercholesterolemia, unspecified: Secondary | ICD-10-CM | POA: Diagnosis not present

## 2019-05-09 DIAGNOSIS — I10 Essential (primary) hypertension: Secondary | ICD-10-CM | POA: Diagnosis not present

## 2019-05-09 DIAGNOSIS — B009 Herpesviral infection, unspecified: Secondary | ICD-10-CM | POA: Diagnosis not present

## 2019-05-11 DIAGNOSIS — N201 Calculus of ureter: Secondary | ICD-10-CM | POA: Diagnosis not present

## 2019-05-18 DIAGNOSIS — E7800 Pure hypercholesterolemia, unspecified: Secondary | ICD-10-CM | POA: Diagnosis not present

## 2019-05-29 ENCOUNTER — Telehealth: Payer: Self-pay | Admitting: *Deleted

## 2019-05-29 ENCOUNTER — Other Ambulatory Visit: Payer: Self-pay

## 2019-05-29 NOTE — Telephone Encounter (Signed)
Received a call from patient asking about a referral that was placed by Dr. Marisue Humble for hemochromatosis to our office. Patient was last seen in office in 2014. Referral coordinator notified.  Referral found and will be calling patient soon.

## 2019-06-11 ENCOUNTER — Inpatient Hospital Stay: Payer: 59

## 2019-06-11 ENCOUNTER — Ambulatory Visit: Payer: 59 | Admitting: Hematology & Oncology

## 2019-07-02 ENCOUNTER — Other Ambulatory Visit: Payer: Self-pay

## 2019-07-02 ENCOUNTER — Inpatient Hospital Stay (HOSPITAL_BASED_OUTPATIENT_CLINIC_OR_DEPARTMENT_OTHER): Payer: 59 | Admitting: Hematology & Oncology

## 2019-07-02 ENCOUNTER — Encounter: Payer: Self-pay | Admitting: Hematology & Oncology

## 2019-07-02 ENCOUNTER — Inpatient Hospital Stay: Payer: 59

## 2019-07-02 ENCOUNTER — Inpatient Hospital Stay: Payer: 59 | Attending: Hematology & Oncology

## 2019-07-02 LAB — CMP (CANCER CENTER ONLY)
ALT: 35 U/L (ref 0–44)
AST: 25 U/L (ref 15–41)
Albumin: 4.8 g/dL (ref 3.5–5.0)
Alkaline Phosphatase: 47 U/L (ref 38–126)
Anion gap: 9 (ref 5–15)
BUN: 21 mg/dL — ABNORMAL HIGH (ref 6–20)
CO2: 26 mmol/L (ref 22–32)
Calcium: 9.3 mg/dL (ref 8.9–10.3)
Chloride: 105 mmol/L (ref 98–111)
Creatinine: 1.41 mg/dL — ABNORMAL HIGH (ref 0.61–1.24)
GFR, Est AFR Am: >60 mL/min (ref >60)
GFR, Estimated: 55 mL/min — ABNORMAL LOW (ref >60)
Glucose, Bld: 116 mg/dL — ABNORMAL HIGH (ref 70–99)
Potassium: 3.6 mmol/L (ref 3.5–5.1)
Sodium: 140 mmol/L (ref 135–145)
Total Bilirubin: 0.5 mg/dL (ref 0.3–1.2)
Total Protein: 7.2 g/dL (ref 6.5–8.1)

## 2019-07-02 LAB — CBC WITH DIFFERENTIAL (CANCER CENTER ONLY)
Abs Immature Granulocytes: 0.02 10*3/uL (ref 0.00–0.07)
Basophils Absolute: 0 10*3/uL (ref 0.0–0.1)
Basophils Relative: 0 %
Eosinophils Absolute: 0.1 10*3/uL (ref 0.0–0.5)
Eosinophils Relative: 2 %
HCT: 41.7 % (ref 39.0–52.0)
Hemoglobin: 14.4 g/dL (ref 13.0–17.0)
Immature Granulocytes: 0 %
Lymphocytes Relative: 22 %
Lymphs Abs: 1.6 10*3/uL (ref 0.7–4.0)
MCH: 30.5 pg (ref 26.0–34.0)
MCHC: 34.5 g/dL (ref 30.0–36.0)
MCV: 88.3 fL (ref 80.0–100.0)
Monocytes Absolute: 0.5 10*3/uL (ref 0.1–1.0)
Monocytes Relative: 7 %
Neutro Abs: 4.9 10*3/uL (ref 1.7–7.7)
Neutrophils Relative %: 69 %
Platelet Count: 203 10*3/uL (ref 150–400)
RBC: 4.72 MIL/uL (ref 4.22–5.81)
RDW: 11.8 % (ref 11.5–15.5)
WBC Count: 7.1 10*3/uL (ref 4.0–10.5)
nRBC: 0 % (ref 0.0–0.2)

## 2019-07-02 NOTE — Progress Notes (Signed)
Brent Odom presents today for phlebotomy per MD orders. Phlebotomy procedure started at 1455 and ended at 1504. 536 grams removed via 16 gauge to Right AC. Patient observed for 30 minutes after procedure without any incident. Patient tolerated procedure well. IV needle removed intact.

## 2019-07-02 NOTE — Progress Notes (Signed)
Hematology and Oncology Follow Up Visit  Brent Odom [id number] 1961-07-23 58 y.o. 07/02/2019   Principle Diagnosis:   Hereditary hemochromatosis -  C282Y Homozygous  Current Therapy:    Phlebotomy to maintain ferritin less than 50 and iron saturation less than 30%     Interim History:  Brent Odom is back for a long awaited visit.  We last saw him back in September 2014.  Since then, Brent Odom has been doing okay.  Does not sound like Brent Odom is been phlebotomized at all.  Not although Brent Odom has been donating to the [employer].  Brent Odom saw his family doctor recently.  Unfortunately, I cannot find any of the labs that show that his iron studies were high.  Brent Odom is feeling okay.  Brent Odom tore his triceps tendon on the right arm about a month or so ago.  Brent Odom had double hernia surgery back in January 2020.  Brent Odom did well with this.  Brent Odom is not exercising as Brent Odom would like because of the injuries that Brent Odom has sustained.  Brent Odom has had no problems with diabetes.  Brent Odom has had no issues with bowels or bladder.  Brent Odom has had no rashes.  Brent Odom has had no headache.  There is no cough.  Brent Odom has been very cautious with the coronavirus.  Of note, we are taking care of his daughter who has lymphoma.  Overall, I would say that his performance status is ECOG 0.  Brent Odom has had no problems with his PEG tube tobacco use.  Brent Odom does have a drink every now and then of alcohol.  Medications:  Current Outpatient Medications:  .  Cholecalciferol (VITAMIN D3) 2000 units TABS, Take 2,000 Units by mouth daily., Disp: , Rfl:  .  DOXYLAMINE SUCCINATE, SLEEP, PO, Take 25 mg by mouth at bedtime as needed (for sleep.)., Disp: , Rfl:  .  fenofibrate 160 MG tablet, Take 160 mg by mouth daily., Disp: , Rfl:  .  lisinopril (PRINIVIL,ZESTRIL) 5 MG tablet, Take 5 mg by mouth daily., Disp: , Rfl:  .  loratadine (CLARITIN) 10 MG tablet, Take 10 mg by mouth daily., Disp: , Rfl:  .  LUTEIN PO, Take 25 mg by mouth daily., Disp: , Rfl:  .  Multiple Vitamin  (MULTIVITAMIN WITH MINERALS) TABS tablet, Take 1 tablet by mouth daily., Disp: , Rfl:  .  Omega-3 Fatty Acids (FISH OIL) 1000 MG CAPS, Take 1,000 mg by mouth daily., Disp: , Rfl:  .  omeprazole (PRILOSEC) 20 MG capsule, Take 20 mg by mouth daily., Disp: , Rfl:  .  Potassium Citrate 15 MEQ (1620 MG) TBCR, Take 1 tablet by mouth 2 (two) times daily., Disp: , Rfl:  .  Saw Palmetto 450 MG CAPS, Take 450 mg by mouth 2 (two) times daily., Disp: , Rfl:  .  Turmeric 500 MG CAPS, Take 500 mg by mouth daily., Disp: , Rfl:  .  acyclovir (ZOVIRAX) 200 MG capsule, Take 200 mg by mouth 5 (five) times daily as needed (for cold sores)., Disp: , Rfl:  .  fluticasone (FLONASE) 50 MCG/ACT nasal spray, Place 1 spray into both nostrils daily as needed for allergies., Disp: , Rfl:  .  ibuprofen (ADVIL,MOTRIN) 200 MG tablet, Take 400 mg by mouth every 8 (eight) hours as needed (for pain.)., Disp: , Rfl:  .  Nutritional Supplements (CREATINE) 750 MG CAPS, Take 2,250 mg by mouth daily., Disp: , Rfl:  .  ondansetron (ZOFRAN ODT) 4 MG disintegrating tablet, Take 1 tablet (4  mg total) by mouth every 8 (eight) hours as needed for nausea or vomiting., Disp: 10 tablet, Rfl: 0 .  oxyCODONE-acetaminophen (PERCOCET/ROXICET) 5-325 MG tablet, Take 1-2 tablets by mouth every 6 (six) hours as needed for severe pain. (Patient not taking: Reported on 07/02/2019), Disp: 20 tablet, Rfl: 0 .  Safflower Oil (CLA) 1000 MG CAPS, Take 1,000 mg by mouth 3 (three) times daily with meals., Disp: , Rfl:   Allergies:  Allergies  Allergen Reactions  . Amoxicillin Other (See Comments)    "Ate the lining of his stomach" & "vomited blood" Has patient had a PCN reaction causing immediate rash, facial/tongue/throat swelling, SOB or lightheadedness with hypotension: No Has patient had a PCN reaction causing severe rash involving mucus membranes or skin necrosis: No Has patient had a PCN reaction that required hospitalization: No Has patient had a PCN  reaction occurring within the last 10 years: #  #  #  YES  #  #  #      Past Medical History, Surgical history, Social history, and Family History were reviewed and updated.  Review of Systems: Review of Systems  Constitutional: Negative.   HENT:  Negative.   Eyes: Negative.   Respiratory: Negative.   Cardiovascular: Negative.   Gastrointestinal: Negative.   Endocrine: Negative.   Genitourinary: Negative.    Musculoskeletal: Negative.   Skin: Negative.   Neurological: Negative.   Hematological: Negative.   Psychiatric/Behavioral: Negative.     Physical Exam:  vitals were not taken for this visit.   Wt Readings from Last 3 Encounters:  11/24/17 177 lb 1.6 oz (80.3 kg)  04/05/13 183 lb (83 kg)  02/21/13 178 lb (80.7 kg)    Physical Exam Vitals signs reviewed.  HENT:     Head: Normocephalic and atraumatic.  Eyes:     Pupils: Pupils are equal, round, and reactive to light.  Neck:     Musculoskeletal: Normal range of motion.  Cardiovascular:     Rate and Rhythm: Normal rate and regular rhythm.     Heart sounds: Normal heart sounds.  Pulmonary:     Effort: Pulmonary effort is normal.     Breath sounds: Normal breath sounds.  Abdominal:     General: Bowel sounds are normal.     Palpations: Abdomen is soft.  Musculoskeletal: Normal range of motion.        General: No tenderness or deformity.  Lymphadenopathy:     Cervical: No cervical adenopathy.  Skin:    General: Skin is warm and dry.     Findings: No erythema or rash.  Neurological:     Mental Status: Brent Odom is alert and oriented to person, place, and time.  Psychiatric:        Behavior: Behavior normal.        Thought Content: Thought content normal.        Judgment: Judgment normal.      Lab Results  Component Value Date   WBC 7.1 07/02/2019   HGB 14.4 07/02/2019   HCT 41.7 07/02/2019   MCV 88.3 07/02/2019   PLT 203 07/02/2019     Chemistry      Component Value Date/Time   NA 140 07/02/2019 1342    K 3.6 07/02/2019 1342   CL 105 07/02/2019 1342   CO2 26 07/02/2019 1342   BUN 21 (H) 07/02/2019 1342   CREATININE 1.41 (H) 07/02/2019 1342      Component Value Date/Time   CALCIUM 9.3 07/02/2019 1342   ALKPHOS  47 07/02/2019 1342   AST 25 07/02/2019 1342   ALT 35 07/02/2019 1342   BILITOT 0.5 07/02/2019 1342       Impression and Plan: Brent Odom is a 58 year old white male who has hemochromatosis.  Brent Odom is homozygous for the major mutation.  It will be interesting to see what his iron studies show.  We will do a phlebotomy on him today.  Brent Odom is not anemic.  Brent Odom definitely can benefit from the phlebotomy.  We will have to see what his iron studies show for [place] to figure out when to have him come back to see [place].  I reassured him regarding his daughter.  I am just happy that Brent Odom is doing well himself.  I was with him for about 40-45 minutes.  It was nice to catch up with him as to what was going on and to find out anything new going on with his health.   Volanda Napoleon, MD 12/7/20202:46 PM

## 2019-07-02 NOTE — Patient Instructions (Signed)
Therapeutic Phlebotomy Therapeutic phlebotomy is the planned removal of blood from a person's body for the purpose of treating a medical condition. The procedure is similar to donating blood. Usually, about a pint (470 mL, or 0.47 L) of blood is removed. The average adult has 9-12 pints (4.3-5.7 L) of blood in the body. Therapeutic phlebotomy may be used to treat the following medical conditions:  Hemochromatosis. This is a condition in which the blood contains too much iron.  Polycythemia vera. This is a condition in which the blood contains too many red blood cells.  Porphyria cutanea tarda. This is a disease in which an important part of hemoglobin is not made properly. It results in the buildup of abnormal amounts of porphyrins in the body.  Sickle cell disease. This is a condition in which the red blood cells form an abnormal crescent shape rather than a round shape. Tell a health care provider about:  Any allergies you have.  All medicines you are taking, including vitamins, herbs, eye drops, creams, and over-the-counter medicines.  Any problems you or family members have had with anesthetic medicines.  Any blood disorders you have.  Any surgeries you have had.  Any medical conditions you have.  Whether you are pregnant or may be pregnant. What are the risks? Generally, this is a safe procedure. However, problems may occur, including:  Nausea or light-headedness.  Low blood pressure (hypotension).  Soreness, bleeding, swelling, or bruising at the needle insertion site.  Infection. What happens before the procedure?  Follow instructions from your health care provider about eating or drinking restrictions.  Ask your health care provider about: ? Changing or stopping your regular medicines. This is especially important if you are taking diabetes medicines or blood thinners (anticoagulants). ? Taking medicines such as aspirin and ibuprofen. These medicines can thin your  blood. Do not take these medicines unless your health care provider tells you to take them. ? Taking over-the-counter medicines, vitamins, herbs, and supplements.  Wear clothing with sleeves that can be raised above the elbow.  Plan to have someone take you home from the hospital or clinic.  You may have a blood sample taken.  Your blood pressure, pulse rate, and breathing rate will be measured. What happens during the procedure?   To lower your risk of infection: ? Your health care team will wash or sanitize their hands. ? Your skin will be cleaned with an antiseptic.  You may be given a medicine to numb the area (local anesthetic).  A tourniquet will be placed on your arm.  A needle will be inserted into one of your veins.  Tubing and a collection bag will be attached to that needle.  Blood will flow through the needle and tubing into the collection bag.  The collection bag will be placed lower than your arm to allow gravity to help the flow of blood into the bag.  You may be asked to open and close your hand slowly and continually during the entire collection.  After the specified amount of blood has been removed from your body, the collection bag and tubing will be clamped.  The needle will be removed from your vein.  Pressure will be held on the site of the needle insertion to stop the bleeding.  A bandage (dressing) will be placed over the needle insertion site. The procedure may vary among health care providers and hospitals. What happens after the procedure?  Your blood pressure, pulse rate, and breathing rate will be   measured after the procedure.  You will be encouraged to drink fluids.  Your recovery will be assessed and monitored.  You can return to your normal activities as told by your health care provider. Summary  Therapeutic phlebotomy is the planned removal of blood from a person's body for the purpose of treating a medical condition.  Therapeutic  phlebotomy may be used to treat hemochromatosis, polycythemia vera, porphyria cutanea tarda, or sickle cell disease.  In the procedure, a needle is inserted and about a pint (470 mL, or 0.47 L) of blood is removed. The average adult has 9-12 pints (4.3-5.7 L) of blood in the body.  This is generally a safe procedure, but it can sometimes cause problems such as nausea, light-headedness, or low blood pressure (hypotension). This information is not intended to replace advice given to you by your health care provider. Make sure you discuss any questions you have with your health care provider. Document Released: 12/14/2010 Document Revised: 07/28/2017 Document Reviewed: 07/28/2017 Elsevier Patient Education  2020 [employer]  

## 2019-07-03 ENCOUNTER — Telehealth: Payer: Self-pay | Admitting: Hematology & Oncology

## 2019-07-03 LAB — IRON AND TIBC
Iron: 152 ug/dL (ref 42–163)
Saturation Ratios: 50 % (ref 20–55)
TIBC: 305 ug/dL (ref 202–409)
UIBC: 153 ug/dL (ref 117–376)

## 2019-07-03 LAB — FERRITIN: Ferritin: 443 ng/mL — ABNORMAL HIGH (ref 24–336)

## 2019-07-03 NOTE — Telephone Encounter (Signed)
No los 12/7 

## 2019-07-09 ENCOUNTER — Other Ambulatory Visit: Payer: Self-pay

## 2019-07-09 ENCOUNTER — Inpatient Hospital Stay: Payer: 59

## 2019-07-09 NOTE — Progress Notes (Signed)
16g PIV to RAC phlebotomized 512ml over 6 minutes. Pt tolerated well, nutrition and hydration provided.

## 2019-07-16 ENCOUNTER — Other Ambulatory Visit: Payer: Self-pay | Admitting: Urology

## 2019-07-18 ENCOUNTER — Other Ambulatory Visit: Payer: Self-pay

## 2019-07-18 ENCOUNTER — Inpatient Hospital Stay: Payer: 59

## 2019-07-18 NOTE — Patient Instructions (Signed)
Therapeutic Phlebotomy Therapeutic phlebotomy is the planned removal of blood from a person's body for the purpose of treating a medical condition. The procedure is similar to donating blood. Usually, about a pint (470 mL, or 0.47 L) of blood is removed. The average adult has 9-12 pints (4.3-5.7 L) of blood in the body. Therapeutic phlebotomy may be used to treat the following medical conditions:  Hemochromatosis. This is a condition in which the blood contains too much iron.  Polycythemia vera. This is a condition in which the blood contains too many red blood cells.  Porphyria cutanea tarda. This is a disease in which an important part of hemoglobin is not made properly. It results in the buildup of abnormal amounts of porphyrins in the body.  Sickle cell disease. This is a condition in which the red blood cells form an abnormal crescent shape rather than a round shape. Tell a health care provider about:  Any allergies you have.  All medicines you are taking, including vitamins, herbs, eye drops, creams, and over-the-counter medicines.  Any problems you or family members have had with anesthetic medicines.  Any blood disorders you have.  Any surgeries you have had.  Any medical conditions you have.  Whether you are pregnant or may be pregnant. What are the risks? Generally, this is a safe procedure. However, problems may occur, including:  Nausea or light-headedness.  Low blood pressure (hypotension).  Soreness, bleeding, swelling, or bruising at the needle insertion site.  Infection. What happens before the procedure?  Follow instructions from your health care provider about eating or drinking restrictions.  Ask your health care provider about: ? Changing or stopping your regular medicines. This is especially important if you are taking diabetes medicines or blood thinners (anticoagulants). ? Taking medicines such as aspirin and ibuprofen. These medicines can thin your  blood. Do not take these medicines unless your health care provider tells you to take them. ? Taking over-the-counter medicines, vitamins, herbs, and supplements.  Wear clothing with sleeves that can be raised above the elbow.  Plan to have someone take you home from the hospital or clinic.  You may have a blood sample taken.  Your blood pressure, pulse rate, and breathing rate will be measured. What happens during the procedure?   To lower your risk of infection: ? Your health care team will wash or sanitize their hands. ? Your skin will be cleaned with an antiseptic.  You may be given a medicine to numb the area (local anesthetic).  A tourniquet will be placed on your arm.  A needle will be inserted into one of your veins.  Tubing and a collection bag will be attached to that needle.  Blood will flow through the needle and tubing into the collection bag.  The collection bag will be placed lower than your arm to allow gravity to help the flow of blood into the bag.  You may be asked to open and close your hand slowly and continually during the entire collection.  After the specified amount of blood has been removed from your body, the collection bag and tubing will be clamped.  The needle will be removed from your vein.  Pressure will be held on the site of the needle insertion to stop the bleeding.  A bandage (dressing) will be placed over the needle insertion site. The procedure may vary among health care providers and hospitals. What happens after the procedure?  Your blood pressure, pulse rate, and breathing rate will be   measured after the procedure.  You will be encouraged to drink fluids.  Your recovery will be assessed and monitored.  You can return to your normal activities as told by your health care provider. Summary  Therapeutic phlebotomy is the planned removal of blood from a person's body for the purpose of treating a medical condition.  Therapeutic  phlebotomy may be used to treat hemochromatosis, polycythemia vera, porphyria cutanea tarda, or sickle cell disease.  In the procedure, a needle is inserted and about a pint (470 mL, or 0.47 L) of blood is removed. The average adult has 9-12 pints (4.3-5.7 L) of blood in the body.  This is generally a safe procedure, but it can sometimes cause problems such as nausea, light-headedness, or low blood pressure (hypotension). This information is not intended to replace advice given to you by your health care provider. Make sure you discuss any questions you have with your health care provider. Document Released: 12/14/2010 Document Revised: 07/28/2017 Document Reviewed: 07/28/2017 Elsevier Patient Education  2020 [employer]  

## 2019-07-18 NOTE — Progress Notes (Signed)
Brent Odom presents today for phlebotomy per MD orders. Phlebotomy procedure started at 0938 and ended at 0942 528 grams removed via 16 gauge toleft AC. Patient observed for 30 minutes after procedure without any incident. Patient tolerated procedure well. IV needle removed intact.

## 2019-08-01 ENCOUNTER — Ambulatory Visit: Payer: 59 | Attending: Internal Medicine

## 2019-08-01 DIAGNOSIS — Z20822 Contact with and (suspected) exposure to covid-19: Secondary | ICD-10-CM

## 2019-08-03 LAB — NOVEL CORONAVIRUS, NAA: SARS-CoV-2, NAA: NOT DETECTED

## 2019-08-06 ENCOUNTER — Inpatient Hospital Stay: Payer: 59

## 2019-08-10 ENCOUNTER — Other Ambulatory Visit: Payer: Self-pay

## 2019-08-10 ENCOUNTER — Inpatient Hospital Stay: Payer: 59 | Attending: Hematology & Oncology

## 2019-08-10 NOTE — Progress Notes (Signed)
Brent Odom presents today for phlebotomy per MD orders. Phlebotomy procedure started at 0915 and ended at 0919. 532 cc removed via 16 G needle at L [facility] site. Patient tolerated procedure well.

## 2019-08-10 NOTE — Patient Instructions (Signed)

## 2019-08-16 ENCOUNTER — Ambulatory Visit: Payer: 59 | Attending: Internal Medicine

## 2019-08-16 DIAGNOSIS — Z20822 Contact with and (suspected) exposure to covid-19: Secondary | ICD-10-CM

## 2019-08-17 LAB — NOVEL CORONAVIRUS, NAA: SARS-CoV-2, NAA: NOT DETECTED

## 2019-08-22 DIAGNOSIS — E7800 Pure hypercholesterolemia, unspecified: Secondary | ICD-10-CM | POA: Diagnosis not present

## 2019-10-04 ENCOUNTER — Ambulatory Visit: Payer: 59 | Attending: Internal Medicine

## 2019-10-04 DIAGNOSIS — Z23 Encounter for immunization: Secondary | ICD-10-CM

## 2019-10-04 NOTE — Progress Notes (Signed)
   Covid-19 Vaccination Clinic  Name:  Brent Odom    MRN: [id number] DOB: [birth year]  10/04/2019  [name] was observed post Covid-19 immunization for 15 minutes without incident. He was provided with Vaccine Information Sheet and instruction to access the V-Safe system.   [name] was instructed to call 911 with any severe reactions post vaccine: Marland Kitchen Difficulty breathing  . Swelling of face and throat  . A fast heartbeat  . A bad rash all over body  . Dizziness and weakness   Immunizations Administered    Name Date Dose VIS Date Route   Pfizer COVID-19 Vaccine 10/04/2019 10:34 AM 0.3 mL 07/06/2019 Intramuscular   Manufacturer: [place]   Lot: [id number]   [place]: [id number]

## 2019-10-29 ENCOUNTER — Ambulatory Visit: Payer: 59 | Attending: Internal Medicine

## 2019-10-29 DIAGNOSIS — Z23 Encounter for immunization: Secondary | ICD-10-CM

## 2019-10-29 NOTE — Progress Notes (Signed)
   Covid-19 Vaccination Clinic  Name:  Brent Odom    MRN: [id number] DOB: Apr 30, 1961  10/29/2019  [name] was observed post Covid-19 immunization for 15 minutes without incident. He was provided with Vaccine Information Sheet and instruction to access the V-Safe system.   [name] was instructed to call 911 with any severe reactions post vaccine: Marland Kitchen Difficulty breathing  . Swelling of face and throat  . A fast heartbeat  . A bad rash all over body  . Dizziness and weakness   Immunizations Administered    Name Date Dose VIS Date Route   Pfizer COVID-19 Vaccine 10/29/2019  9:42 AM 0.3 mL 07/06/2019 Intramuscular   Manufacturer: [place]   Lot: [id number]   [place]: [id number]

## 2020-03-12 DIAGNOSIS — Z20828 Contact with and (suspected) exposure to other viral communicable diseases: Secondary | ICD-10-CM | POA: Diagnosis not present

## 2020-05-09 DIAGNOSIS — I10 Essential (primary) hypertension: Secondary | ICD-10-CM | POA: Diagnosis not present

## 2020-05-09 DIAGNOSIS — B009 Herpesviral infection, unspecified: Secondary | ICD-10-CM | POA: Diagnosis not present

## 2020-05-09 DIAGNOSIS — Z86010 Personal history of colonic polyps: Secondary | ICD-10-CM | POA: Diagnosis not present

## 2020-05-09 DIAGNOSIS — E7800 Pure hypercholesterolemia, unspecified: Secondary | ICD-10-CM | POA: Diagnosis not present

## 2020-05-09 DIAGNOSIS — Z125 Encounter for screening for malignant neoplasm of prostate: Secondary | ICD-10-CM | POA: Diagnosis not present

## 2020-05-09 DIAGNOSIS — Z889 Allergy status to unspecified drugs, medicaments and biological substances status: Secondary | ICD-10-CM | POA: Diagnosis not present

## 2020-05-14 ENCOUNTER — Encounter: Payer: Self-pay | Admitting: *Deleted

## 2020-05-23 ENCOUNTER — Other Ambulatory Visit: Payer: Self-pay | Admitting: Family Medicine

## 2020-06-09 ENCOUNTER — Inpatient Hospital Stay (HOSPITAL_BASED_OUTPATIENT_CLINIC_OR_DEPARTMENT_OTHER): Payer: 59 | Admitting: Family

## 2020-06-09 ENCOUNTER — Encounter: Payer: Self-pay | Admitting: Family

## 2020-06-09 ENCOUNTER — Other Ambulatory Visit: Payer: Self-pay

## 2020-06-09 ENCOUNTER — Inpatient Hospital Stay: Payer: 59

## 2020-06-09 ENCOUNTER — Encounter: Payer: Self-pay | Admitting: *Deleted

## 2020-06-09 ENCOUNTER — Other Ambulatory Visit: Payer: Self-pay | Admitting: Family

## 2020-06-09 ENCOUNTER — Telehealth: Payer: Self-pay

## 2020-06-09 ENCOUNTER — Inpatient Hospital Stay: Payer: 59 | Attending: Hematology & Oncology

## 2020-06-09 LAB — CMP (CANCER CENTER ONLY)
ALT: 23 U/L (ref 0–44)
AST: 22 U/L (ref 15–41)
Albumin: 4.6 g/dL (ref 3.5–5.0)
Alkaline Phosphatase: 52 U/L (ref 38–126)
Anion gap: 8 (ref 5–15)
BUN: 21 mg/dL — ABNORMAL HIGH (ref 6–20)
CO2: 27 mmol/L (ref 22–32)
Calcium: 10.2 mg/dL (ref 8.9–10.3)
Chloride: 103 mmol/L (ref 98–111)
Creatinine: 1.5 mg/dL — ABNORMAL HIGH (ref 0.61–1.24)
GFR, Estimated: 53 mL/min — ABNORMAL LOW (ref >60)
Glucose, Bld: 110 mg/dL — ABNORMAL HIGH (ref 70–99)
Potassium: 4.4 mmol/L (ref 3.5–5.1)
Sodium: 138 mmol/L (ref 135–145)
Total Bilirubin: 0.5 mg/dL (ref 0.3–1.2)
Total Protein: 6.9 g/dL (ref 6.5–8.1)

## 2020-06-09 LAB — CBC WITH DIFFERENTIAL (CANCER CENTER ONLY)
Abs Immature Granulocytes: 0.03 10*3/uL (ref 0.00–0.07)
Basophils Absolute: 0 10*3/uL (ref 0.0–0.1)
Basophils Relative: 0 %
Eosinophils Absolute: 0.1 10*3/uL (ref 0.0–0.5)
Eosinophils Relative: 1 %
HCT: 42.9 % (ref 39.0–52.0)
Hemoglobin: 14.5 g/dL (ref 13.0–17.0)
Immature Granulocytes: 1 %
Lymphocytes Relative: 20 %
Lymphs Abs: 1.3 10*3/uL (ref 0.7–4.0)
MCH: 30.2 pg (ref 26.0–34.0)
MCHC: 33.8 g/dL (ref 30.0–36.0)
MCV: 89.4 fL (ref 80.0–100.0)
Monocytes Absolute: 0.6 10*3/uL (ref 0.1–1.0)
Monocytes Relative: 10 %
Neutro Abs: 4.4 10*3/uL (ref 1.7–7.7)
Neutrophils Relative %: 68 %
Platelet Count: 196 10*3/uL (ref 150–400)
RBC: 4.8 MIL/uL (ref 4.22–5.81)
RDW: 12.2 % (ref 11.5–15.5)
WBC Count: 6.4 10*3/uL (ref 4.0–10.5)
nRBC: 0 % (ref 0.0–0.2)

## 2020-06-09 NOTE — Progress Notes (Signed)
Hematology and Oncology Follow Up Visit  Brent Odom [id number] 11/28/1960 59 y.o. 06/09/2020   Principle Diagnosis:  Hereditary hemochromatosis -  C282Y Homozygous  Current Therapy:        Phlebotomy to maintain ferritin less than 50 and iron saturation less than 30%   Interim History:  Brent Odom is here today for follow-up. In October, his ferritin was elevated at 233 and ferritin 30% Hgb is 14.5 and Hct 42.9.   No blood loss. No bruising or petechiae.  He is symptomatic with ruddy complexion, hot flashes and persistent headaches.  He denies fever, chills, n/v, cough, rash, dizziness, changes in or loss of vision, SOB, chest pain, palpitations, abdominal pain or changes in bowel or bladder habits.  He has positional numbness and tingling in his arms that resolve with his moving out of that position.  No swelling or tenderness in his extremities.  No falls or syncope.  He has maintained a good appetite and is staying well hydrated. His weight is stable.   ECOG Performance Status: 1 - Symptomatic but completely ambulatory  Medications:  Allergies as of 06/09/2020      Reactions   Amoxicillin Other (See Comments)   "Ate the lining of his stomach" & "vomited blood" Has patient had a PCN reaction causing immediate rash, facial/tongue/throat swelling, SOB or lightheadedness with hypotension: No Has patient had a PCN reaction causing severe rash involving mucus membranes or skin necrosis: No Has patient had a PCN reaction that required hospitalization: No Has patient had a PCN reaction occurring within the last 10 years: #  #  #  YES  #  #  #       Medication List       Accurate as of June 09, 2020  1:32 PM. If you have any questions, ask your nurse or doctor.        acyclovir 200 MG capsule Commonly known as: ZOVIRAX Take 200 mg by mouth 5 (five) times daily as needed (for cold sores).   CLA 1000 MG Caps Take 1,000 mg by mouth 3 (three) times daily with  meals.   COVID-19 Specimen Collection Kit See admin instructions. for testing   Creatine 750 MG Caps Take 2,250 mg by mouth daily.   DOXYLAMINE SUCCINATE (SLEEP) PO Take 25 mg by mouth at bedtime as needed (for sleep.).   fenofibrate 160 MG tablet Take 160 mg by mouth daily.   Fish Oil 1000 MG Caps Take 1,000 mg by mouth daily.   fluticasone 50 MCG/ACT nasal spray Commonly known as: FLONASE Place 1 spray into both nostrils daily as needed for allergies.   ibuprofen 200 MG tablet Commonly known as: ADVIL Take 400 mg by mouth every 8 (eight) hours as needed (for pain.).   lisinopril 5 MG tablet Commonly known as: ZESTRIL Take 5 mg by mouth daily.   loratadine 10 MG tablet Commonly known as: CLARITIN Take 10 mg by mouth daily.   LUTEIN PO Take 25 mg by mouth daily.   multivitamin with minerals Tabs tablet Take 1 tablet by mouth daily.   omeprazole 20 MG capsule Commonly known as: PRILOSEC Take 20 mg by mouth daily.   ondansetron 4 MG disintegrating tablet Commonly known as: Zofran ODT Take 1 tablet (4 mg total) by mouth every 8 (eight) hours as needed for nausea or vomiting.   oxyCODONE-acetaminophen 5-325 MG tablet Commonly known as: PERCOCET/ROXICET Take 1-2 tablets by mouth every 6 (six) hours as needed for severe pain.  Potassium Citrate 15 MEQ (1620 MG) Tbcr TAKE 1 TABLET BY MOUTH TWICE A DAY   Saw Palmetto 450 MG Caps Take 450 mg by mouth 2 (two) times daily.   Turmeric 500 MG Caps Take 500 mg by mouth daily.   valACYclovir 500 MG tablet Commonly known as: VALTREX Take 500 mg by mouth daily.   Vitamin D3 50 MCG (2000 UT) Tabs Take 2,000 Units by mouth daily.       Allergies:  Allergies  Allergen Reactions  . Amoxicillin Other (See Comments)    "Ate the lining of his stomach" & "vomited blood" Has patient had a PCN reaction causing immediate rash, facial/tongue/throat swelling, SOB or lightheadedness with hypotension: No Has patient had a  PCN reaction causing severe rash involving mucus membranes or skin necrosis: No Has patient had a PCN reaction that required hospitalization: No Has patient had a PCN reaction occurring within the last 10 years: #  #  #  YES  #  #  #      Past Medical History, Surgical history, Social history, and Family History were reviewed and updated.  Review of Systems: All other 10 point review of systems is negative.   Physical Exam:  weight is 188 lb 6.4 oz (85.5 kg). His temperature is 98.3 F (36.8 C). His blood pressure is 123/67 and his pulse is 75. His respiration is 18 and oxygen saturation is 99%.   Wt Readings from Last 3 Encounters:  06/09/20 188 lb 6.4 oz (85.5 kg)  11/24/17 177 lb 1.6 oz (80.3 kg)  04/05/13 183 lb (83 kg)    Ocular: Sclerae unicteric, pupils equal, round and reactive to light Ear-nose-throat: Oropharynx clear, dentition fair Lymphatic: No cervical or supraclavicular adenopathy Lungs no rales or rhonchi, good excursion bilaterally Heart regular rate and rhythm, no murmur appreciated Abd soft, nontender, positive bowel sounds, no liver or spleen tip palpated on exam, no fluid wave  MSK no focal spinal tenderness, no joint edema Neuro: non-focal, well-oriented, appropriate affect Breasts: Deferred   Lab Results  Component Value Date   WBC 6.4 06/09/2020   HGB 14.5 06/09/2020   HCT 42.9 06/09/2020   MCV 89.4 06/09/2020   PLT 196 06/09/2020   Lab Results  Component Value Date   FERRITIN 443 (H) 07/02/2019   IRON 152 07/02/2019   TIBC 305 07/02/2019   UIBC 153 07/02/2019   IRONPCTSAT 50 07/02/2019   Lab Results  Component Value Date   RBC 4.80 06/09/2020   No results found for: KPAFRELGTCHN, LAMBDASER, KAPLAMBRATIO No results found for: IGGSERUM, IGA, IGMSERUM No results found for: Ronnald Ramp, A1GS, A2GS, Violet Baldy, MSPIKE, SPEI   Chemistry      Component Value Date/Time   NA 138 06/09/2020 1107   K 4.4 06/09/2020 1107    CL 103 06/09/2020 1107   CO2 27 06/09/2020 1107   BUN 21 (H) 06/09/2020 1107   CREATININE 1.50 (H) 06/09/2020 1107      Component Value Date/Time   CALCIUM 10.2 06/09/2020 1107   ALKPHOS 52 06/09/2020 1107   AST 22 06/09/2020 1107   ALT 23 06/09/2020 1107   BILITOT 0.5 06/09/2020 1107       Impression and Plan: [name] is a very pleasant 59 yo caucasian gentleman with hemochromatosis, homozygous for the C282Y mutation.  We will phlebotomize him every other week x2 and then recheck in 8 weeks.  Labs every 8 weeks and follow-up in 6 months.  Patient is in agreement  with the plan.  Form faxed with phlebotomy instructions to [facility].  He can follow-up with our office with any questions or concerns.   Laverna Peace, NP 11/15/20211:32 PM

## 2020-06-09 NOTE — Telephone Encounter (Signed)
Appts made and printed for pt per 06/09/20 loa... AOM

## 2020-06-09 NOTE — Progress Notes (Signed)
Orders for phlebotomies faxed to [facility] per order of S. Imbery NP.

## 2020-08-02 ENCOUNTER — Other Ambulatory Visit: Payer: Self-pay

## 2020-08-02 DIAGNOSIS — Z20822 Contact with and (suspected) exposure to covid-19: Secondary | ICD-10-CM

## 2020-08-04 ENCOUNTER — Other Ambulatory Visit: Payer: 59

## 2020-08-05 LAB — NOVEL CORONAVIRUS, NAA: SARS-CoV-2, NAA: NOT DETECTED

## 2020-08-07 ENCOUNTER — Telehealth: Payer: Self-pay

## 2020-08-07 DIAGNOSIS — R82998 Other abnormal findings in urine: Secondary | ICD-10-CM | POA: Diagnosis not present

## 2020-08-07 DIAGNOSIS — N200 Calculus of kidney: Secondary | ICD-10-CM | POA: Diagnosis not present

## 2020-08-07 NOTE — Telephone Encounter (Signed)
Returned pts call to r/s his 08/11/20 lab to later date due to possible weather   aom

## 2020-08-11 ENCOUNTER — Other Ambulatory Visit: Payer: Self-pay

## 2020-08-15 ENCOUNTER — Other Ambulatory Visit: Payer: Self-pay

## 2020-08-15 ENCOUNTER — Encounter: Payer: Self-pay | Admitting: Family

## 2020-08-15 ENCOUNTER — Inpatient Hospital Stay: Payer: BC Managed Care – PPO | Attending: Hematology & Oncology

## 2020-08-15 LAB — CMP (CANCER CENTER ONLY)
ALT: 26 U/L (ref 0–44)
AST: 22 U/L (ref 15–41)
Albumin: 4.4 g/dL (ref 3.5–5.0)
Alkaline Phosphatase: 39 U/L (ref 38–126)
Anion gap: 7 (ref 5–15)
BUN: 24 mg/dL — ABNORMAL HIGH (ref 6–20)
CO2: 28 mmol/L (ref 22–32)
Calcium: 9.7 mg/dL (ref 8.9–10.3)
Chloride: 105 mmol/L (ref 98–111)
Creatinine: 1.6 mg/dL — ABNORMAL HIGH (ref 0.61–1.24)
GFR, Estimated: 49 mL/min — ABNORMAL LOW (ref >60)
Glucose, Bld: 106 mg/dL — ABNORMAL HIGH (ref 70–99)
Potassium: 4.3 mmol/L (ref 3.5–5.1)
Sodium: 140 mmol/L (ref 135–145)
Total Bilirubin: 0.5 mg/dL (ref 0.3–1.2)
Total Protein: 6.8 g/dL (ref 6.5–8.1)

## 2020-08-15 LAB — CBC WITH DIFFERENTIAL (CANCER CENTER ONLY)
Abs Immature Granulocytes: 0.03 10*3/uL (ref 0.00–0.07)
Basophils Absolute: 0 10*3/uL (ref 0.0–0.1)
Basophils Relative: 0 %
Eosinophils Absolute: 0.1 10*3/uL (ref 0.0–0.5)
Eosinophils Relative: 1 %
HCT: 38.3 % — ABNORMAL LOW (ref 39.0–52.0)
Hemoglobin: 13.1 g/dL (ref 13.0–17.0)
Immature Granulocytes: 0 %
Lymphocytes Relative: 15 %
Lymphs Abs: 1.1 10*3/uL (ref 0.7–4.0)
MCH: 30.8 pg (ref 26.0–34.0)
MCHC: 34.2 g/dL (ref 30.0–36.0)
MCV: 89.9 fL (ref 80.0–100.0)
Monocytes Absolute: 0.8 10*3/uL (ref 0.1–1.0)
Monocytes Relative: 10 %
Neutro Abs: 5.7 10*3/uL (ref 1.7–7.7)
Neutrophils Relative %: 74 %
Platelet Count: 178 10*3/uL (ref 150–400)
RBC: 4.26 MIL/uL (ref 4.22–5.81)
RDW: 12.7 % (ref 11.5–15.5)
WBC Count: 7.8 10*3/uL (ref 4.0–10.5)
nRBC: 0 % (ref 0.0–0.2)

## 2020-08-15 LAB — FERRITIN: Ferritin: 228 ng/mL (ref 24–336)

## 2020-08-15 LAB — IRON AND TIBC
Iron: 76 ug/dL (ref 42–163)
Saturation Ratios: 23 % (ref 20–55)
TIBC: 323 ug/dL (ref 202–409)
UIBC: 247 ug/dL (ref 117–376)

## 2020-09-29 ENCOUNTER — Inpatient Hospital Stay: Payer: BC Managed Care – PPO | Attending: Hematology & Oncology

## 2020-09-29 ENCOUNTER — Other Ambulatory Visit: Payer: Self-pay

## 2020-09-29 LAB — CMP (CANCER CENTER ONLY)
ALT: 20 U/L (ref 0–44)
AST: 21 U/L (ref 15–41)
Albumin: 4.4 g/dL (ref 3.5–5.0)
Alkaline Phosphatase: 49 U/L (ref 38–126)
Anion gap: 7 (ref 5–15)
BUN: 17 mg/dL (ref 6–20)
CO2: 26 mmol/L (ref 22–32)
Calcium: 9.3 mg/dL (ref 8.9–10.3)
Chloride: 104 mmol/L (ref 98–111)
Creatinine: 1.48 mg/dL — ABNORMAL HIGH (ref 0.61–1.24)
GFR, Estimated: 54 mL/min — ABNORMAL LOW (ref >60)
Glucose, Bld: 107 mg/dL — ABNORMAL HIGH (ref 70–99)
Potassium: 4.4 mmol/L (ref 3.5–5.1)
Sodium: 137 mmol/L (ref 135–145)
Total Bilirubin: 0.4 mg/dL (ref 0.3–1.2)
Total Protein: 6.6 g/dL (ref 6.5–8.1)

## 2020-09-29 LAB — CBC WITH DIFFERENTIAL (CANCER CENTER ONLY)
Abs Immature Granulocytes: 0.02 10*3/uL (ref 0.00–0.07)
Basophils Absolute: 0 10*3/uL (ref 0.0–0.1)
Basophils Relative: 0 %
Eosinophils Absolute: 0.1 10*3/uL (ref 0.0–0.5)
Eosinophils Relative: 2 %
HCT: 38.9 % — ABNORMAL LOW (ref 39.0–52.0)
Hemoglobin: 13.2 g/dL (ref 13.0–17.0)
Immature Granulocytes: 0 %
Lymphocytes Relative: 25 %
Lymphs Abs: 1.3 10*3/uL (ref 0.7–4.0)
MCH: 31.1 pg (ref 26.0–34.0)
MCHC: 33.9 g/dL (ref 30.0–36.0)
MCV: 91.7 fL (ref 80.0–100.0)
Monocytes Absolute: 0.5 10*3/uL (ref 0.1–1.0)
Monocytes Relative: 9 %
Neutro Abs: 3.4 10*3/uL (ref 1.7–7.7)
Neutrophils Relative %: 64 %
Platelet Count: 194 10*3/uL (ref 150–400)
RBC: 4.24 MIL/uL (ref 4.22–5.81)
RDW: 12.8 % (ref 11.5–15.5)
WBC Count: 5.3 10*3/uL (ref 4.0–10.5)
nRBC: 0 % (ref 0.0–0.2)

## 2020-09-29 LAB — IRON AND TIBC
Iron: 104 ug/dL (ref 42–163)
Saturation Ratios: 30 % (ref 20–55)
TIBC: 350 ug/dL (ref 202–409)
UIBC: 246 ug/dL (ref 117–376)

## 2020-09-29 LAB — FERRITIN: Ferritin: 57 ng/mL (ref 24–336)

## 2020-09-30 ENCOUNTER — Telehealth: Payer: Self-pay

## 2020-09-30 ENCOUNTER — Encounter: Payer: Self-pay | Admitting: Family

## 2020-09-30 NOTE — Telephone Encounter (Signed)
Called and left a vm per 09/29/20 sch message that pt needs [facility]    Brent Odom

## 2020-10-07 DIAGNOSIS — Z01812 Encounter for preprocedural laboratory examination: Secondary | ICD-10-CM | POA: Diagnosis not present

## 2020-10-09 DIAGNOSIS — K635 Polyp of colon: Secondary | ICD-10-CM | POA: Diagnosis not present

## 2020-10-09 DIAGNOSIS — K5730 Diverticulosis of large intestine without perforation or abscess without bleeding: Secondary | ICD-10-CM | POA: Diagnosis not present

## 2020-10-09 DIAGNOSIS — K648 Other hemorrhoids: Secondary | ICD-10-CM | POA: Diagnosis not present

## 2020-10-09 DIAGNOSIS — Z86010 Personal history of colonic polyps: Secondary | ICD-10-CM | POA: Diagnosis not present

## 2020-11-24 ENCOUNTER — Inpatient Hospital Stay: Payer: BC Managed Care – PPO | Admitting: Family

## 2020-11-24 ENCOUNTER — Inpatient Hospital Stay: Payer: BC Managed Care – PPO | Attending: Hematology & Oncology

## 2020-12-31 DIAGNOSIS — N200 Calculus of kidney: Secondary | ICD-10-CM | POA: Diagnosis not present

## 2021-01-12 DIAGNOSIS — N200 Calculus of kidney: Secondary | ICD-10-CM | POA: Diagnosis not present

## 2021-01-22 DIAGNOSIS — N200 Calculus of kidney: Secondary | ICD-10-CM | POA: Diagnosis not present

## 2021-01-22 DIAGNOSIS — I700 Atherosclerosis of aorta: Secondary | ICD-10-CM | POA: Diagnosis not present

## 2021-04-20 DIAGNOSIS — H6980 Other specified disorders of Eustachian tube, unspecified ear: Secondary | ICD-10-CM | POA: Diagnosis not present

## 2021-05-18 DIAGNOSIS — B009 Herpesviral infection, unspecified: Secondary | ICD-10-CM | POA: Diagnosis not present

## 2021-05-18 DIAGNOSIS — R7309 Other abnormal glucose: Secondary | ICD-10-CM | POA: Diagnosis not present

## 2021-05-18 DIAGNOSIS — I10 Essential (primary) hypertension: Secondary | ICD-10-CM | POA: Diagnosis not present

## 2021-05-18 DIAGNOSIS — E7800 Pure hypercholesterolemia, unspecified: Secondary | ICD-10-CM | POA: Diagnosis not present

## 2021-05-18 DIAGNOSIS — Z23 Encounter for immunization: Secondary | ICD-10-CM | POA: Diagnosis not present

## 2021-05-27 DIAGNOSIS — M545 Low back pain, unspecified: Secondary | ICD-10-CM | POA: Diagnosis not present

## 2021-05-27 DIAGNOSIS — M25512 Pain in left shoulder: Secondary | ICD-10-CM | POA: Diagnosis not present

## 2021-06-01 DIAGNOSIS — M47816 Spondylosis without myelopathy or radiculopathy, lumbar region: Secondary | ICD-10-CM | POA: Diagnosis not present

## 2021-06-01 DIAGNOSIS — M5451 Vertebrogenic low back pain: Secondary | ICD-10-CM | POA: Diagnosis not present

## 2021-06-04 DIAGNOSIS — M47816 Spondylosis without myelopathy or radiculopathy, lumbar region: Secondary | ICD-10-CM | POA: Diagnosis not present

## 2021-06-04 DIAGNOSIS — M5451 Vertebrogenic low back pain: Secondary | ICD-10-CM | POA: Diagnosis not present

## 2021-06-09 DIAGNOSIS — M5451 Vertebrogenic low back pain: Secondary | ICD-10-CM | POA: Diagnosis not present

## 2021-06-09 DIAGNOSIS — M47816 Spondylosis without myelopathy or radiculopathy, lumbar region: Secondary | ICD-10-CM | POA: Diagnosis not present

## 2021-06-11 DIAGNOSIS — M5451 Vertebrogenic low back pain: Secondary | ICD-10-CM | POA: Diagnosis not present

## 2021-06-11 DIAGNOSIS — M47816 Spondylosis without myelopathy or radiculopathy, lumbar region: Secondary | ICD-10-CM | POA: Diagnosis not present

## 2021-06-15 DIAGNOSIS — M47816 Spondylosis without myelopathy or radiculopathy, lumbar region: Secondary | ICD-10-CM | POA: Diagnosis not present

## 2021-06-15 DIAGNOSIS — M5451 Vertebrogenic low back pain: Secondary | ICD-10-CM | POA: Diagnosis not present

## 2021-06-17 DIAGNOSIS — M47816 Spondylosis without myelopathy or radiculopathy, lumbar region: Secondary | ICD-10-CM | POA: Diagnosis not present

## 2021-06-17 DIAGNOSIS — M5451 Vertebrogenic low back pain: Secondary | ICD-10-CM | POA: Diagnosis not present

## 2021-06-23 DIAGNOSIS — M5451 Vertebrogenic low back pain: Secondary | ICD-10-CM | POA: Diagnosis not present

## 2021-06-23 DIAGNOSIS — M47816 Spondylosis without myelopathy or radiculopathy, lumbar region: Secondary | ICD-10-CM | POA: Diagnosis not present

## 2021-06-29 DIAGNOSIS — M5451 Vertebrogenic low back pain: Secondary | ICD-10-CM | POA: Diagnosis not present

## 2021-06-29 DIAGNOSIS — M47816 Spondylosis without myelopathy or radiculopathy, lumbar region: Secondary | ICD-10-CM | POA: Diagnosis not present

## 2021-06-29 DIAGNOSIS — M25512 Pain in left shoulder: Secondary | ICD-10-CM | POA: Diagnosis not present

## 2021-07-09 DIAGNOSIS — R300 Dysuria: Secondary | ICD-10-CM | POA: Diagnosis not present

## 2021-07-09 DIAGNOSIS — R197 Diarrhea, unspecified: Secondary | ICD-10-CM | POA: Diagnosis not present

## 2021-07-09 DIAGNOSIS — K30 Functional dyspepsia: Secondary | ICD-10-CM | POA: Diagnosis not present

## 2022-05-18 DIAGNOSIS — R7309 Other abnormal glucose: Secondary | ICD-10-CM | POA: Diagnosis not present

## 2022-05-18 DIAGNOSIS — B009 Herpesviral infection, unspecified: Secondary | ICD-10-CM | POA: Diagnosis not present

## 2022-05-18 DIAGNOSIS — I10 Essential (primary) hypertension: Secondary | ICD-10-CM | POA: Diagnosis not present

## 2022-05-18 DIAGNOSIS — E7800 Pure hypercholesterolemia, unspecified: Secondary | ICD-10-CM | POA: Diagnosis not present

## 2022-05-18 DIAGNOSIS — Z125 Encounter for screening for malignant neoplasm of prostate: Secondary | ICD-10-CM | POA: Diagnosis not present

## 2023-04-15 DIAGNOSIS — R079 Chest pain, unspecified: Secondary | ICD-10-CM | POA: Diagnosis not present

## 2023-04-15 DIAGNOSIS — R002 Palpitations: Secondary | ICD-10-CM | POA: Diagnosis not present

## 2023-05-03 DIAGNOSIS — J019 Acute sinusitis, unspecified: Secondary | ICD-10-CM | POA: Diagnosis not present

## 2023-05-04 DIAGNOSIS — R002 Palpitations: Secondary | ICD-10-CM | POA: Diagnosis not present

## 2023-05-24 DIAGNOSIS — Z1159 Encounter for screening for other viral diseases: Secondary | ICD-10-CM | POA: Diagnosis not present

## 2023-05-24 DIAGNOSIS — I10 Essential (primary) hypertension: Secondary | ICD-10-CM | POA: Diagnosis not present

## 2023-05-24 DIAGNOSIS — R1310 Dysphagia, unspecified: Secondary | ICD-10-CM | POA: Diagnosis not present

## 2023-05-24 DIAGNOSIS — H6980 Other specified disorders of Eustachian tube, unspecified ear: Secondary | ICD-10-CM | POA: Diagnosis not present

## 2023-05-24 DIAGNOSIS — Z0000 Encounter for general adult medical examination without abnormal findings: Secondary | ICD-10-CM | POA: Diagnosis not present

## 2023-05-24 DIAGNOSIS — E7800 Pure hypercholesterolemia, unspecified: Secondary | ICD-10-CM | POA: Diagnosis not present

## 2023-05-24 DIAGNOSIS — Z125 Encounter for screening for malignant neoplasm of prostate: Secondary | ICD-10-CM | POA: Diagnosis not present

## 2023-05-26 ENCOUNTER — Other Ambulatory Visit (HOSPITAL_COMMUNITY): Payer: Self-pay | Admitting: Family Medicine

## 2023-05-26 DIAGNOSIS — E7800 Pure hypercholesterolemia, unspecified: Secondary | ICD-10-CM

## 2023-06-02 ENCOUNTER — Ambulatory Visit (HOSPITAL_COMMUNITY)
Admission: RE | Admit: 2023-06-02 | Discharge: 2023-06-02 | Disposition: A | Discharge status: Home / Self Care | Payer: Self-pay | Source: Ambulatory Visit | Attending: Family Medicine | Admitting: Family Medicine

## 2023-06-02 DIAGNOSIS — E7800 Pure hypercholesterolemia, unspecified: Secondary | ICD-10-CM | POA: Insufficient documentation

## 2023-06-02 DIAGNOSIS — I2510 Atherosclerotic heart disease of native coronary artery without angina pectoris: Secondary | ICD-10-CM | POA: Diagnosis not present

## 2023-06-09 DIAGNOSIS — C44612 Basal cell carcinoma of skin of right upper limb, including shoulder: Secondary | ICD-10-CM | POA: Diagnosis not present

## 2023-06-09 DIAGNOSIS — D225 Melanocytic nevi of trunk: Secondary | ICD-10-CM | POA: Diagnosis not present

## 2023-06-09 DIAGNOSIS — B078 Other viral warts: Secondary | ICD-10-CM | POA: Diagnosis not present

## 2023-06-09 DIAGNOSIS — L821 Other seborrheic keratosis: Secondary | ICD-10-CM | POA: Diagnosis not present

## 2023-07-15 DIAGNOSIS — Z08 Encounter for follow-up examination after completed treatment for malignant neoplasm: Secondary | ICD-10-CM | POA: Diagnosis not present

## 2023-07-15 DIAGNOSIS — Z85828 Personal history of other malignant neoplasm of skin: Secondary | ICD-10-CM | POA: Diagnosis not present

## 2023-08-15 ENCOUNTER — Encounter: Payer: Self-pay | Admitting: Cardiovascular Disease

## 2023-08-15 NOTE — Progress Notes (Signed)
 Appt cancelled due to weather  This encounter was created in error - please disregard.

## 2023-08-17 ENCOUNTER — Ambulatory Visit: Payer: BC Managed Care – PPO | Admitting: Cardiovascular Disease

## 2023-08-19 ENCOUNTER — Ambulatory Visit: Payer: BC Managed Care – PPO | Admitting: Cardiology

## 2023-08-19 DIAGNOSIS — L570 Actinic keratosis: Secondary | ICD-10-CM | POA: Diagnosis not present

## 2023-08-19 DIAGNOSIS — L821 Other seborrheic keratosis: Secondary | ICD-10-CM | POA: Diagnosis not present

## 2023-08-19 DIAGNOSIS — L820 Inflamed seborrheic keratosis: Secondary | ICD-10-CM | POA: Diagnosis not present

## 2023-08-19 DIAGNOSIS — X32XXXD Exposure to sunlight, subsequent encounter: Secondary | ICD-10-CM | POA: Diagnosis not present

## 2023-08-19 NOTE — Progress Notes (Unsigned)
Cardiology Office Note:    Date:  08/22/2023   ID:  Brent Odom, DOB 18-Jan-1961, MRN [id number]  PCP:  Irven Coe, MD   [facility] Providers Cardiologist:  None     Referring MD: Irven Coe, MD   Chief Complaint  Patient presents with   Coronary Artery Disease    History of Present Illness:    Brent Odom is a 63 y.o. male is seen at the request of Dr Lewie Chamber for evaluation of CAD. He has a history of HTN and HLD. He had a recent screening coronary calcium score of 400 placing him in the 85th percentile. He also has a history of hemachromatosis.   The patient reports that in the past several months he has experienced symptoms of his chest throbbing and notes HR go up to 125. Usually at rest. This may last a few minutes then go away. Last Monday he noted while lying down similar symptoms associated with sensation he could not get his breath. HR went up to 125 again. He was seen by PCP. 2 week event monitor apparently was OK. Calcium score done as noted above. He does note his BP has been running high at home. Wife is a former cardiac nurse. One time in the past he had a hypotensive episode at the gym and that is why he is on such a low dose of lisinopril. He is statin intolerant due to arthralgias and myalgias.   Past Medical History:  Diagnosis Date   Arthritis    CAD (coronary artery disease)    Chest pain    CTS (carpal tunnel syndrome)    Diverticulosis large intestine w/o perforation or abscess w/o bleeding    Drug-induced myopathy    Dysphagia    Hemochromatosis    History of adenomatous polyp of colon    History of kidney stones    HSV infection    Hypercholesterolemia    Hyperlipidemia    Hypertension    Inguinal hernia    Migraine    Palpitations    RLS (restless legs syndrome)    Sleep disturbance    Statin intolerance    Stomach pain    Tinnitus of right ear     Past Surgical History:  Procedure Laterality Date   COLONOSCOPY      INGUINAL HERNIA REPAIR Right 12/02/2017   Procedure: LAPAROSCOPIC RIGHT INGUINAL HERNIA REPAIR WITH MESH;  Surgeon: Berna Bue, MD;  Location: [facility] OR;  Service: General;  Laterality: Right;   LIVER BIOPSY     30+ years ago   SHOULDER ARTHROSCOPY Right 2008    Current Medications: Current Meds  Medication Sig   aspirin EC 81 MG tablet Take 1 tablet (81 mg total) by mouth daily. Swallow whole.   Cholecalciferol (VITAMIN D3) 2000 units TABS Take 2,000 Units by mouth daily.   COVID-19 Specimen Collection KIT See admin instructions. for testing   fluticasone (FLONASE) 50 MCG/ACT nasal spray Place 1 spray into both nostrils daily as needed for allergies.   ibuprofen (ADVIL,MOTRIN) 200 MG tablet Take 400 mg by mouth every 8 (eight) hours as needed (for pain.).   lisinopril (ZESTRIL) 10 MG tablet Take 1 tablet (10 mg total) by mouth daily.   LUTEIN PO Take 25 mg by mouth daily.   Lysine 1000 MG TABS Take by mouth.   magnesium gluconate (MAGONATE) 500 MG tablet Take 500 mg by mouth 2 (two) times daily.   metoprolol tartrate (LOPRESSOR) 100 MG tablet Take  2 hours prior to CT   Multiple Vitamin (MULTIVITAMIN WITH MINERALS) TABS tablet Take 1 tablet by mouth daily.   Omega-3 Fatty Acids (FISH OIL) 1000 MG CAPS Take 1,000 mg by mouth daily.   omeprazole (PRILOSEC) 20 MG capsule Take 20 mg by mouth daily.   Saw Palmetto 450 MG CAPS Take 450 mg by mouth 2 (two) times daily.   tamsulosin (FLOMAX) 0.4 MG CAPS capsule Take 0.4 mg by mouth.   Turmeric 500 MG CAPS Take 500 mg by mouth daily.   valACYclovir (VALTREX) 500 MG tablet Take 500 mg by mouth daily.   [DISCONTINUED] fenofibrate 160 MG tablet Take 160 mg by mouth daily.   [DISCONTINUED] lisinopril (PRINIVIL,ZESTRIL) 5 MG tablet Take 5 mg by mouth daily.     Allergies:   Amoxicillin, Atorvastatin calcium, Pravastatin, and Simvastatin   Social History   Socioeconomic History   Marital status: Married    Spouse name: Not on file   Number  of children: 3   Years of education: Not on file   Highest education level: Not on file  Occupational History   Not on file  Tobacco Use   Smoking status: Never   Smokeless tobacco: Never  Vaping Use   Vaping status: Never Used  Substance and Sexual Activity   Alcohol use: Yes    Comment: glass of wine/beer nightly   Drug use: Never   Sexual activity: Not on file  Other Topics Concern   Not on file  Social History Narrative   Agricultural consultant for insurance claims   Social Drivers of Health   Financial Resource Strain: Not on file  Food Insecurity: Not on file  Transportation Needs: Not on file  Physical Activity: Not on file  Stress: Not on file  Social Connections: Not on file     Family History: The patient's family history includes Arrhythmia in his father; Heart attack in his father; Hemachromatosis in his brother, brother, and brother; Liver cancer in his father.  ROS:   Please see the history of present illness.     All other systems reviewed and are negative.  EKGs/Labs/Other Studies Reviewed:    The following studies were reviewed today:  EKG Interpretation Date/Time:  Monday August 22 2023 09:23:27 EST Ventricular Rate:  73 PR Interval:  154 QRS Duration:  76 QT Interval:  372 QTC Calculation: 409 R Axis:   2  Text Interpretation: Normal sinus rhythm with sinus arrhythmia Normal ECG When compared with ECG of 24-Nov-2017 09:02, No significant change was found Confirmed by [place], Young Brim [phone]) on 08/22/2023 9:27:48 AM   Coronary Calcium Score   TECHNIQUE: The patient was scanned on a Siemens Somatom 64 slice scanner. Axial non-contrast 3mm slices were carried out through the heart. The data set was analyzed on a dedicated work station and scored using the Agatson method.   FINDINGS: Non-cardiac: No significant non cardiac findings on limited lung and soft tissue windows. See separate report from [facility].   Ascending Aorta: Normal  diameter 3.4 cm   Pericardium: Normal   Coronary arteries: LM and 2 vessel calcium noted   LM 28.4   LAD 180   RCA 192   LCX 0   Total 400   IMPRESSION: Coronary calcium score of 400. This was 5 th percentile for age and sex matched control.   Charlton Haws   Electronically Signed: By: Charlton Haws M.D. On: 06/02/2023 16:39   EKG Interpretation Date/Time:  Monday August 22 2023 09:23:27  EST Ventricular Rate:  73 PR Interval:  154 QRS Duration:  76 QT Interval:  372 QTC Calculation: 409 R Axis:   2  Text Interpretation: Normal sinus rhythm with sinus arrhythmia Normal ECG When compared with ECG of 24-Nov-2017 09:02, No significant change was found Confirmed by [place], Shaquanta Harkless [phone]) on 08/22/2023 9:27:48 AM    Recent Labs: No results found for requested labs within last 365 days.  Recent Lipid Panel No results found for: "CHOL", "TRIG", "HDL", "CHOLHDL", "VLDL", "LDLCALC", "LDLDIRECT" Dated 05/24/23: cholesterol 260, triglycerides 125, HDL 69, LDL 169, CBC, ALT, potassium normal.   Risk Assessment/Calculations:        Physical Exam:    VS:  BP (!) 120/90   Pulse 77   Ht 5\' 8"  (1.727 m)   Wt 190 lb (86.2 kg)   SpO2 99%   BMI 28.89 kg/m     Wt Readings from Last 3 Encounters:  08/22/23 190 lb (86.2 kg)  06/09/20 188 lb 6.4 oz (85.5 kg)  11/24/17 177 lb 1.6 oz (80.3 kg)     GEN:  Well nourished, well developed in no acute distress HEENT: Normal NECK: No JVD; No carotid bruits LYMPHATICS: No lymphadenopathy CARDIAC: RRR, no murmurs, rubs, gallops RESPIRATORY:  Clear to auscultation without rales, wheezing or rhonchi  ABDOMEN: Soft, non-tender, non-distended MUSCULOSKELETAL:  No edema; No deformity  SKIN: Warm and dry NEUROLOGIC:  Alert and oriented x 3 PSYCHIATRIC:  Normal affect   ASSESSMENT:    1. Coronary artery disease of native artery of native heart with stable angina pectoris (HCC)   2. Essential hypertension   3. Chest pain,  precordial   4. HYPERCHOLESTEROLEMIA   5. Myalgia due to statin   6. Precordial pain    PLAN:    In order of problems listed above:  CAD with high coronary calcium score. He does have some concerning symptoms with acute dyspnea at rest and atypical chest discomfort. Recommend coronary CTA to further assess risk. Will start ASA 81 mg daily.  HTN. Not at optimal control with goal < 130/80. Will increase lisinopril to 5 mg bid Hypercholesterolemia. Goal LDL< 55. No history of hypertriglyceridemia so will stop fenofibrate. He is statin intolerant so will refer to Pharm D to consider Repatha.  Palpitations. Request a copy of his event monitor to review. Consider investing in Kardiomobile device to document rhythm.            Medication Adjustments/Labs and Tests Ordered: Current medicines are reviewed at length with the patient today.  Concerns regarding medicines are outlined above.  Orders Placed This Encounter  Procedures   CT CORONARY MORPH W/CTA COR W/SCORE W/CA W/CM &/OR WO/CM   Basic metabolic panel   AMB Referral to Heartcare Pharm-D   EKG 12-Lead   Meds ordered this encounter  Medications   lisinopril (ZESTRIL) 10 MG tablet    Sig: Take 1 tablet (10 mg total) by mouth daily.    Dispense:  90 tablet    Refill:  3   aspirin EC 81 MG tablet    Sig: Take 1 tablet (81 mg total) by mouth daily. Swallow whole.   metoprolol tartrate (LOPRESSOR) 100 MG tablet    Sig: Take 2 hours prior to CT    Dispense:  1 tablet    Refill:  0    Patient Instructions  Medication Instructions:  START Aspirin 81 mg tablet INCREASE Lisinopril to 10 mg tablet STOP fenofibrate 160 mg tablet  *If you need a refill  on your cardiac medications before your next appointment, please call your pharmacy*   Lab Work: BMET today If you have labs (blood work) drawn today and your tests are completely normal, you will receive your results only by: MyChart Message (if you have MyChart) OR A paper  copy in the mail If you have any lab test that is abnormal or we need to change your treatment, we will call you to review the results.   Testing/Procedures:   Your cardiac CT will be scheduled at one of the below locations:   [facility] [address] [place], [place] [address] [phone]  OR  [facility] [address] [place], [place] [address] [phone]  OR   [facility] [address] [place], [place] [address] [phone]  OR   [facility] [address] [place], [place] [address] [phone]  If scheduled at [facility], please arrive at the [facility] and Children's Entrance (Entrance C2) of [facility] 30 minutes prior to test start time. You can use the FREE valet parking offered at entrance C (encouraged to control the heart rate for the test)  Proceed to the [facility] Radiology Department (first floor) to check-in and test prep.  All radiology patients and guests should use entrance C2 at [facility], accessed from [facility], even though the hospital's physical address listed is [address]    If scheduled at [facility] or [facility], please arrive 15 mins early for check-in and test prep.  There is spacious parking and easy access to the radiology department from the [facility] Heart and Vascular entrance. Please enter here and check-in with the desk attendant.   Please follow these instructions carefully (unless otherwise directed):  An IV will be required for this test and Nitroglycerin will be given.  Hold all erectile dysfunction medications at least 3 days (72 hrs) prior to test. (Ie viagra, cialis, sildenafil, tadalafil, etc)   On the Night Before the Test: Be sure to Drink plenty of water. Do not consume any caffeinated/decaffeinated beverages or  chocolate 12 hours prior to your test. Do not take any antihistamines 12 hours prior to your test. If the patient has contrast allergy: Patient will need a prescription for Prednisone and very clear instructions (as follows): Prednisone 50 mg - take 13 hours prior to test Take another Prednisone 50 mg 7 hours prior to test Take another Prednisone 50 mg 1 hour prior to test Take Benadryl 50 mg 1 hour prior to test Patient must complete all four doses of above prophylactic medications. Patient will need a ride after test due to Benadryl.  On the Day of the Test: Drink plenty of water until 1 hour prior to the test. Do not eat any food 1 hour prior to test. You may take your regular medications prior to the test.  Take metoprolol (Lopressor) 100 mg two hours prior to test.  If you take Furosemide/Hydrochlorothiazide/Spironolactone/Chlorthalidone, please HOLD on the morning of the test. Patients who wear a continuous glucose monitor MUST remove the device prior to scanning. FEMALES- please wear underwire-free bra if available, avoid dresses & tight clothing       After the Test: Drink plenty of water. After receiving IV contrast, you may experience a mild flushed feeling. This is normal. On occasion, you may experience a mild rash up to 24 hours after the  test. This is not dangerous. If this occurs, you can take Benadryl 25 mg and increase your fluid intake. If you experience trouble breathing, this can be serious. If it is severe call 911 IMMEDIATELY. If it is mild, please call our office.  We will call to schedule your test 2-4 weeks out understanding that some insurance companies will need an authorization prior to the service being performed.   For more information and frequently asked questions, please visit our website : [email or web address]  For non-scheduling related questions, please contact the cardiac imaging nurse navigator should you have any  questions/concerns: Cardiac Imaging Nurse Navigators Direct Office Dial: [phone]   For scheduling needs, including cancellations and rescheduling, please call [place], [phone].   Follow-Up: At [facility], you and your health needs are our priority.  As part of our continuing mission to provide you with exceptional heart care, we have created designated Provider Care Teams.  These Care Teams include your primary Cardiologist (physician) and Advanced Practice Providers (APPs -  Physician Assistants and Nurse Practitioners) who all work together to provide you with the care you need, when you need it.   Your next appointment:    TBD  Provider:   Dr. [place]   Other Instructions Schedule appt with pharmacy for Lipid treatment (Repatha)      Signed, Garnell Begeman [place], MD  08/22/2023 10:30 AM    [facility]

## 2023-08-22 ENCOUNTER — Encounter: Payer: Self-pay | Admitting: Cardiology

## 2023-08-22 ENCOUNTER — Ambulatory Visit: Payer: BC Managed Care – PPO | Attending: Cardiology | Admitting: Cardiology

## 2023-08-22 VITALS — BP 120/90 | HR 77 | Ht 68.0 in | Wt 190.0 lb

## 2023-08-22 DIAGNOSIS — R072 Precordial pain: Secondary | ICD-10-CM | POA: Diagnosis not present

## 2023-08-22 DIAGNOSIS — I25118 Atherosclerotic heart disease of native coronary artery with other forms of angina pectoris: Secondary | ICD-10-CM | POA: Diagnosis not present

## 2023-08-22 DIAGNOSIS — M791 Myalgia, unspecified site: Secondary | ICD-10-CM

## 2023-08-22 DIAGNOSIS — I10 Essential (primary) hypertension: Secondary | ICD-10-CM | POA: Diagnosis not present

## 2023-08-22 DIAGNOSIS — E7800 Pure hypercholesterolemia, unspecified: Secondary | ICD-10-CM

## 2023-08-22 DIAGNOSIS — T466X5D Adverse effect of antihyperlipidemic and antiarteriosclerotic drugs, subsequent encounter: Secondary | ICD-10-CM

## 2023-08-22 LAB — BASIC METABOLIC PANEL
BUN/Creatinine Ratio: 14 (ref 10–24)
BUN: 20 mg/dL (ref 8–27)
CO2: 21 mmol/L (ref 20–29)
Calcium: 10 mg/dL (ref 8.6–10.2)
Chloride: 104 mmol/L (ref 96–106)
Creatinine, Ser: 1.4 mg/dL — ABNORMAL HIGH (ref 0.76–1.27)
Glucose: 105 mg/dL — ABNORMAL HIGH (ref 70–99)
Potassium: 4.8 mmol/L (ref 3.5–5.2)
Sodium: 142 mmol/L (ref 134–144)
eGFR: 56 mL/min/{1.73_m2} — ABNORMAL LOW (ref >59)

## 2023-08-22 MED ORDER — ASPIRIN 81 MG PO TBEC: 81.0000 mg | DELAYED_RELEASE_TABLET | Freq: Every day | ORAL | Status: AC

## 2023-08-22 MED ORDER — METOPROLOL TARTRATE 100 MG PO TABS
ORAL_TABLET | ORAL | 0 refills | Status: AC
Start: 2023-08-22 — End: ?

## 2023-08-22 MED ORDER — LISINOPRIL 10 MG PO TABS: 10.0000 mg | ORAL_TABLET | Freq: Every day | ORAL | 3 refills | Status: AC

## 2023-08-22 NOTE — Patient Instructions (Addendum)
Medication Instructions:  START Aspirin 81 mg tablet INCREASE Lisinopril to 10 mg tablet STOP fenofibrate 160 mg tablet  *If you need a refill on your cardiac medications before your next appointment, please call your pharmacy*   Lab Work: BMET today If you have labs (blood work) drawn today and your tests are completely normal, you will receive your results only by: MyChart Message (if you have MyChart) OR A paper copy in the mail If you have any lab test that is abnormal or we need to change your treatment, we will call you to review the results.   Testing/Procedures:   Your cardiac CT will be scheduled at one of the below locations:   [facility] [address] [place], [place] [address] [phone]  OR  [facility] [address] [place], [place] [address] [phone]  OR   [facility] [address] [place], [place] [address] [phone]  OR   [facility] [address] [place], [place] [address] [phone]  If scheduled at [facility], please arrive at the [facility] Entrance (Entrance C2) of [facility] 30 minutes prior to test start time. You can use the FREE valet parking offered at entrance C (encouraged to control the heart rate for the test)  Proceed to the [facility] Radiology Department (first floor) to check-in and test prep.  All radiology patients and guests should use entrance C2 at [facility], accessed from [facility], even though the hospital's physical address listed is [address]    If scheduled at [facility] or [facility], please arrive 15 mins early for check-in and test prep.  There is spacious parking and easy access to the radiology department from the [facility] entrance. Please enter here and check-in with  the desk attendant.   Please follow these instructions carefully (unless otherwise directed):  An IV will be required for this test and Nitroglycerin will be given.  Hold all erectile dysfunction medications at least 3 days (72 hrs) prior to test. (Ie viagra, cialis, sildenafil, tadalafil, etc)   On the Night Before the Test: Be sure to Drink plenty of water. Do not consume any caffeinated/decaffeinated beverages or chocolate 12 hours prior to your test. Do not take any antihistamines 12 hours prior to your test. If the patient has contrast allergy: Patient will need a prescription for Prednisone and very clear instructions (as follows): Prednisone 50 mg - take 13 hours prior to test Take another Prednisone 50 mg 7 hours prior to test Take another Prednisone 50 mg 1 hour prior to test Take Benadryl 50 mg 1 hour prior to test Patient must complete all four doses of above prophylactic medications. Patient will need a ride after test due to Benadryl.  On the Day of the Test: Drink plenty of water until 1 hour prior to the test. Do not eat any food 1 hour prior to test. You may take your regular medications prior to the test.  Take metoprolol (Lopressor) 100 mg two hours prior to test.  If you take Furosemide/Hydrochlorothiazide/Spironolactone/Chlorthalidone, please HOLD on the morning of the test. Patients who wear a continuous glucose monitor MUST remove the device prior to scanning. FEMALES- please wear underwire-free bra if available, avoid dresses & tight clothing       After the Test: Drink plenty of water. After receiving IV contrast,  you may experience a mild flushed feeling. This is normal. On occasion, you may experience a mild rash up to 24 hours after the test. This is not dangerous. If this occurs, you can take Benadryl 25 mg and increase your fluid intake. If you experience trouble breathing, this can be serious. If it is severe call 911 IMMEDIATELY. If it is mild, please  call our office.  We will call to schedule your test 2-4 weeks out understanding that some insurance companies will need an authorization prior to the service being performed.   For more information and frequently asked questions, please visit our website : [email or web address]  For non-scheduling related questions, please contact the cardiac imaging nurse navigator should you have any questions/concerns: Cardiac Imaging Nurse Navigators Direct Office Dial: [phone]   For scheduling needs, including cancellations and rescheduling, please call [place], [phone].   Follow-Up: At [facility], you and your health needs are our priority.  As part of our continuing mission to provide you with exceptional heart care, we have created designated Provider Care Teams.  These Care Teams include your primary Cardiologist (physician) and Advanced Practice Providers (APPs -  Physician Assistants and Nurse Practitioners) who all work together to provide you with the care you need, when you need it.   Your next appointment:    TBD  Provider:   Dr. [place]   Other Instructions Schedule appt with pharmacy for Lipid treatment (Repatha)

## 2023-08-24 ENCOUNTER — Other Ambulatory Visit (HOSPITAL_COMMUNITY): Payer: Self-pay

## 2023-08-24 ENCOUNTER — Encounter: Payer: Self-pay | Admitting: Pharmacist Clinician (PhC)/ Clinical Pharmacy Specialist

## 2023-08-24 ENCOUNTER — Telehealth: Payer: Self-pay | Admitting: Pharmacist Clinician (PhC)/ Clinical Pharmacy Specialist

## 2023-08-24 ENCOUNTER — Telehealth: Payer: Self-pay | Admitting: Pharmacy Technician

## 2023-08-24 ENCOUNTER — Ambulatory Visit
Payer: BC Managed Care – PPO | Attending: Cardiovascular Disease | Admitting: Pharmacist Clinician (PhC)/ Clinical Pharmacy Specialist

## 2023-08-24 DIAGNOSIS — E7800 Pure hypercholesterolemia, unspecified: Secondary | ICD-10-CM | POA: Diagnosis not present

## 2023-08-24 NOTE — Telephone Encounter (Signed)
Please do PA for Repatha

## 2023-08-24 NOTE — Patient Instructions (Addendum)
Your Results:             Your most recent labs Goal  Total Cholesterol 260 < 200  Triglycerides 125 < 150  HDL (happy/good cholesterol) 69 > 40  LDL (lousy/bad cholesterol 169 < 70   Medication changes:  We will start the process to get Repatha covered by your insurance.  Once the prior authorization is complete, I will call/send a MyChart message to let you know and confirm pharmacy information.   You will take one injection every 14 days  Lab orders:  We want to repeat labs after 2-3 months.  We will send you a lab order to remind you once we get closer to that time.    Patient Co-pay card:  (will bring the price down to "as low as $15/month") 1.      Go to [email or web address]  2.      Click the red "Repahta Co-Pay Card" button near the top right 3.      Scroll down and click "Yes, I have a Repatha prescription 4.      Continue to scroll down and selected "[employer]"  5.      Continue to scroll down and for the question "Are you eligible for Medicare but receiving prescription drug coverage from a former employer, union, or welfare plan?" select "No" 6.      Continue to scroll down and click the first box which is next to "Repatha Co-Pay Card, and beneath that, select "I want to enroll in the Repatha Co-Pay Card Program 7.      Continue to scroll down until you see the "Next" button, then click it 8.      Fill out your personal information then click the "Next" button     Thank you for choosing [facility]

## 2023-08-24 NOTE — Progress Notes (Signed)
Office Visit    Patient Name: Brent Odom Date of Encounter: 08/24/2023  Primary Care Provider:  Irven Coe, MD Primary Cardiologist:  None  Chief Complaint    Hyperlipidemia   Significant Past Medical History   CAD 11/24 CAC = 400, (mostly LAD and RCA)  HTN Diastolic hypertension at last visit, on lisinopril 10 mg  hemochromatosis Has concerns about using medications that work in the liver     Allergies  Allergen Reactions   Amoxicillin Other (See Comments)    "Ate the lining of his stomach" & "vomited blood" Has patient had a PCN reaction causing immediate rash, facial/tongue/throat swelling, SOB or lightheadedness with hypotension: No Has patient had a PCN reaction causing severe rash involving mucus membranes or skin necrosis: No Has patient had a PCN reaction that required hospitalization: No Has patient had a PCN reaction occurring within the last 10 years: #  #  #  YES  #  #  #     Atorvastatin Calcium Other (See Comments)    myalgias   Pravastatin Other (See Comments)    myalgias   Simvastatin Other (See Comments)    myalgias    History of Present Illness    HPI: 63 yo m coming in today for the management of his hyperlipidemia. He has tried statins in the past (simvastatin, pravastatin, atorvastatin), however, he was intolerant to them, developing arthralgias and myalgias. A recent screening showed a coronary calcium score of 400, putting him in the 85th percentile. PMH: CAD(aspirin), HTN(Lisinopril), HLD Family History Father: HLD, 3 MI's, HTN Mother: Died 30   Social History: Tobacco:  Cigar every other year Alcohol:  Couple of drinks a night, usually red wine, maybe a cocktail Diet:  Eat out maybe once a week Tries to eat fruits and vegetables as much as he can Eats as much fresh as possible Red meat, pork, chicken, not a lot of fried food, protein shakes and smoothies  Exercise:  Uses free weights until the last 4-6 months, slowed down as  he's gotten older, work has gotten in the way Has been sedentary recently, put on about 12 lbs  Accessory Clinical Findings   No results found for: "CHOL", "HDL", "LDLCALC", "LDLDIRECT", "TRIG", "CHOLHDL"  No results found for: "LIPOA"  Lab Results  Component Value Date   ALT 20 09/29/2020   AST 21 09/29/2020   ALKPHOS 49 09/29/2020   BILITOT 0.4 09/29/2020   Lab Results  Component Value Date   CREATININE 1.40 (H) 08/22/2023   BUN 20 08/22/2023   NA 142 08/22/2023   K 4.8 08/22/2023   CL 104 08/22/2023   CO2 21 08/22/2023   No results found for: "HGBA1C"  Home Medications    Current Outpatient Medications  Medication Sig Dispense Refill   aspirin EC 81 MG tablet Take 1 tablet (81 mg total) by mouth daily. Swallow whole.     Cholecalciferol (VITAMIN D3) 2000 units TABS Take 2,000 Units by mouth daily.     fluticasone (FLONASE) 50 MCG/ACT nasal spray Place 1 spray into both nostrils daily as needed for allergies.     ibuprofen (ADVIL,MOTRIN) 200 MG tablet Take 400 mg by mouth every 8 (eight) hours as needed (for pain.).     lisinopril (ZESTRIL) 10 MG tablet Take 1 tablet (10 mg total) by mouth daily. 90 tablet 3   Lysine 1000 MG TABS Take by mouth.     magnesium gluconate (MAGONATE) 500 MG tablet Take 500 mg by mouth  2 (two) times daily.     metoprolol tartrate (LOPRESSOR) 100 MG tablet Take 2 hours prior to CT 1 tablet 0   Multiple Vitamin (MULTIVITAMIN WITH MINERALS) TABS tablet Take 1 tablet by mouth daily.     Omega-3 Fatty Acids (FISH OIL) 1000 MG CAPS Take 1,000 mg by mouth daily.     omeprazole (PRILOSEC) 20 MG capsule Take 20 mg by mouth daily.     Saw Palmetto 450 MG CAPS Take 450 mg by mouth 2 (two) times daily.     tamsulosin (FLOMAX) 0.4 MG CAPS capsule Take 0.4 mg by mouth.     Turmeric 500 MG CAPS Take 500 mg by mouth daily.     valACYclovir (VALTREX) 500 MG tablet Take 500 mg by mouth daily.     No current facility-administered medications for this visit.      Assessment & Plan    HYPERCHOLESTEROLEMIA HLD:  - Goal LDL< 70 - Currently not at goal with an LDL of 169 - Counseled on the MOA, administration, dosing, AE, and expected lipid lowering of Repatha and Leqvio  - Will start on Repatha after prior authorization - Will get repeat lipid panel in 2-3 months  - Will order a hepatic panel after the 2nd dose to assess liver function with hemochromatosis    Phillips Hay, PharmD CPP [facility] [address]  [place], [place] [address] [phone]  08/24/2023, 3:45 PM

## 2023-08-24 NOTE — Assessment & Plan Note (Signed)
HLD:  - Goal LDL< 70 - Currently not at goal with an LDL of 169 - Counseled on the MOA, administration, dosing, AE, and expected lipid lowering of Repatha and Leqvio  - Will start on Repatha after prior authorization - Will get repeat lipid panel in 2-3 months  - Will order a hepatic panel after the 2nd dose to assess liver function with hemochromatosis

## 2023-08-24 NOTE — Telephone Encounter (Signed)
Pharmacy Patient Advocate Encounter   Received notification from Pt Calls Messages that prior authorization for Repatha is required/requested.   Insurance verification completed.   The patient is insured through [facility] .   Per test claim: PA required; PA submitted to above mentioned insurance via CoverMyMeds Key/confirmation #/EOC [id number] Status is pending

## 2023-08-25 ENCOUNTER — Other Ambulatory Visit (HOSPITAL_COMMUNITY): Payer: Self-pay

## 2023-08-25 NOTE — Telephone Encounter (Signed)
Pharmacy Patient Advocate Encounter  Received notification from [facility] that Prior Authorization for Repatha SureClick 140MG /ML auto-injectors has been APPROVED from 08/24/23 to 02/19/24. Ran test claim, Copay is $24.99. This test claim was processed through [facility]- copay amounts may vary at other pharmacies due to pharmacy/plan contracts, or as the patient moves through the different stages of their insurance plan.   PA #/Case ID/Reference #: [id number]

## 2023-08-29 MED ORDER — REPATHA SURECLICK 140 MG/ML ~~LOC~~ SOAJ
140.0000 mg | SUBCUTANEOUS | 3 refills | Status: AC
Start: 2023-08-29 — End: ?

## 2023-08-29 NOTE — Addendum Note (Signed)
Addended by: Rosalee Kaufman on: 08/29/2023 06:55 AM   Modules accepted: Orders

## 2023-09-05 ENCOUNTER — Telehealth (HOSPITAL_COMMUNITY): Payer: Self-pay | Admitting: *Deleted

## 2023-09-05 NOTE — Telephone Encounter (Signed)
 Reaching out to patient to offer assistance regarding upcoming cardiac imaging study; pt verbalizes understanding of appt date/time, parking situation and where to check in, pre-test NPO status and medications ordered, and verified current allergies; name and call back number provided for further questions should they arise  Chase Copping RN Navigator Cardiac Imaging [facility] [phone] office [phone] cell  Patient to take 100mg  metoprolol  tartrate two hours prior to his cardiac CT scan. He is aware to arrive to 8 AM.

## 2023-09-06 ENCOUNTER — Ambulatory Visit (HOSPITAL_COMMUNITY)
Admission: RE | Admit: 2023-09-06 | Discharge: 2023-09-06 | Disposition: A | Discharge status: Home / Self Care | Payer: BC Managed Care – PPO | Source: Ambulatory Visit | Attending: Cardiology | Admitting: Cardiology

## 2023-09-06 ENCOUNTER — Ambulatory Visit (HOSPITAL_BASED_OUTPATIENT_CLINIC_OR_DEPARTMENT_OTHER)
Admission: RE | Admit: 2023-09-06 | Discharge: 2023-09-06 | Disposition: A | Discharge status: Home / Self Care | Payer: BC Managed Care – PPO | Source: Ambulatory Visit | Attending: Internal Medicine | Admitting: Internal Medicine

## 2023-09-06 DIAGNOSIS — R072 Precordial pain: Secondary | ICD-10-CM | POA: Diagnosis not present

## 2023-09-06 DIAGNOSIS — E7800 Pure hypercholesterolemia, unspecified: Secondary | ICD-10-CM | POA: Diagnosis not present

## 2023-09-06 DIAGNOSIS — T466X5A Adverse effect of antihyperlipidemic and antiarteriosclerotic drugs, initial encounter: Secondary | ICD-10-CM | POA: Diagnosis not present

## 2023-09-06 DIAGNOSIS — M791 Myalgia, unspecified site: Secondary | ICD-10-CM | POA: Diagnosis not present

## 2023-09-06 DIAGNOSIS — I10 Essential (primary) hypertension: Secondary | ICD-10-CM | POA: Diagnosis not present

## 2023-09-06 DIAGNOSIS — I25118 Atherosclerotic heart disease of native coronary artery with other forms of angina pectoris: Secondary | ICD-10-CM | POA: Diagnosis not present

## 2023-09-06 DIAGNOSIS — R931 Abnormal findings on diagnostic imaging of heart and coronary circulation: Secondary | ICD-10-CM | POA: Diagnosis not present

## 2023-09-06 MED ORDER — NITROGLYCERIN 0.4 MG SL SUBL
SUBLINGUAL_TABLET | SUBLINGUAL | Status: AC
  Filled 2023-09-06: qty 2

## 2023-09-06 MED ORDER — IOHEXOL 350 MG/ML SOLN
95.0000 mL | Freq: Once | INTRAVENOUS | Status: AC | PRN
Start: 2023-09-06 — End: 2023-09-06
  Administered 2023-09-06: 95 mL via INTRAVENOUS

## 2023-09-06 MED ORDER — NITROGLYCERIN 0.4 MG SL SUBL
0.8000 mg | SUBLINGUAL_TABLET | Freq: Once | SUBLINGUAL | Status: AC
  Administered 2023-09-06: 0.8 mg via SUBLINGUAL

## 2023-11-24 ENCOUNTER — Telehealth: Payer: Self-pay | Admitting: Pharmacist Clinician (PhC)/ Clinical Pharmacy Specialist

## 2023-11-24 DIAGNOSIS — E7800 Pure hypercholesterolemia, unspecified: Secondary | ICD-10-CM

## 2023-11-24 NOTE — Telephone Encounter (Signed)
 Needs lipid labs

## 2023-12-08 NOTE — Progress Notes (Deleted)
 Cardiology Office Note:    Date:  12/08/2023   ID:  Brent Odom, DOB [birth year], MRN [id number]  PCP:  Benedetto Brady, MD   [facility] Providers Cardiologist:  None     Referring MD: Benedetto Brady, MD   No chief complaint on file.   History of Present Illness:    Brent Odom is a 63 y.o. male is seen at the request of Dr Ivey Marlin for evaluation of CAD. He has a history of HTN and HLD. He had a recent screening coronary calcium score of 400 placing him in the 85th percentile. He also has a history of hemachromatosis.   The patient reports that in the past several months he has experienced symptoms of his chest throbbing and notes HR go up to 125. Usually at rest. This may last a few minutes then go away. Last Monday he noted while lying down similar symptoms associated with sensation he could not get his breath. HR went up to 125 again. He was seen by PCP. 2 week event monitor apparently was OK. Calcium score done as noted above. He does note his BP has been running high at home. Wife is a former cardiac nurse. One time in the past he had a hypotensive episode at the gym and that is why he is on such a low dose of lisinopril . He is statin intolerant due to arthralgias and myalgias.   He did undergo coronary CTA showing moderate nonobstructive CAD. He was seen by pharm D and approved for Repatha .   Past Medical History:  Diagnosis Date   Arthritis    CAD (coronary artery disease)    Chest pain    CTS (carpal tunnel syndrome)    Diverticulosis large intestine w/o perforation or abscess w/o bleeding    Drug-induced myopathy    Dysphagia    Hemochromatosis    History of adenomatous polyp of colon    History of kidney stones    HSV infection    Hypercholesterolemia    Hyperlipidemia    Hypertension    Inguinal hernia    Migraine    Palpitations    RLS (restless legs syndrome)    Sleep disturbance    Statin intolerance    Stomach pain    Tinnitus of right ear      Past Surgical History:  Procedure Laterality Date   COLONOSCOPY     INGUINAL HERNIA REPAIR Right 12/02/2017   Procedure: LAPAROSCOPIC RIGHT INGUINAL HERNIA REPAIR WITH MESH;  Surgeon: Adalberto Acton, MD;  Location: [facility] OR;  Service: General;  Laterality: Right;   LIVER BIOPSY     30+ years ago   SHOULDER ARTHROSCOPY Right 2008    Current Medications: No outpatient medications have been marked as taking for the 12/12/23 encounter (Appointment) with [place], Fany Cavanaugh M, MD.     Allergies:   Amoxicillin, Atorvastatin calcium, Pravastatin, and Simvastatin   Social History   Socioeconomic History   Marital status: Married    Spouse name: Not on file   Number of children: 3   Years of education: Not on file   Highest education level: Not on file  Occupational History   Not on file  Tobacco Use   Smoking status: Never   Smokeless tobacco: Never  Vaping Use   Vaping status: Never Used  Substance and Sexual Activity   Alcohol use: Yes    Comment: glass of wine/beer nightly   Drug use: Never   Sexual activity: Not on file  Other Topics Concern   Not on file  Social History Narrative   Agricultural consultant for insurance claims   Social Drivers of Health   Financial Resource Strain: Not on file  Food Insecurity: Not on file  Transportation Needs: Not on file  Physical Activity: Not on file  Stress: Not on file  Social Connections: Not on file     Family History: The patient's family history includes Arrhythmia in his father; Heart attack in his father; Hemachromatosis in his brother, brother, and brother; Liver cancer in his father.  ROS:   Please see the history of present illness.     All other systems reviewed and are negative.  EKGs/Labs/Other Studies Reviewed:    The following studies were reviewed today:      Coronary Calcium Score   TECHNIQUE: The patient was scanned on a Siemens Somatom 64 slice scanner. Axial non-contrast 3mm slices were carried out  through the heart. The data set was analyzed on a dedicated work station and scored using the Agatson method.   FINDINGS: Non-cardiac: No significant non cardiac findings on limited lung and soft tissue windows. See separate report from [facility].   Ascending Aorta: Normal diameter 3.4 cm   Pericardium: Normal   Coronary arteries: LM and 2 vessel calcium noted   LM 28.4   LAD 180   RCA 192   LCX 0   Total 400   IMPRESSION: Coronary calcium score of 400. This was 66 th percentile for age and sex matched control.   Janelle Mediate   Electronically Signed: By: Janelle Mediate M.D. On: 06/02/2023 16:39       Coronary CTA 09/06/23: Cardiac/Coronary  CTA   TECHNIQUE: The patient was scanned on a [employer].   FINDINGS: Scan was triggered in the descending thoracic aorta. Axial non-contrast 3 mm slices were carried out through the heart. The data set was analyzed on a dedicated work station and scored using the Agatson method. Gantry rotation speed was 250 msecs and collimation was .6 mm. 0.8 mg of sl NTG was given. The 3D data set was reconstructed in 5% intervals of the 67-82 % of the R-R cycle. Diastolic phases were analyzed on a dedicated work station using MPR, MIP and VRT modes. The patient received 95 cc of contrast.   Coronary Arteries:  Normal coronary origin.  Right dominance.   Coronary Calcium Score:   Left main: 0   Left anterior descending artery: 77   Left circumflex artery: 0   Right coronary artery: 141   Total: 217   Percentile: 73rd for age, sex, and race matched control.   Plaque Analysis:   Left main: 4 Cubic mm calcified plaque, 26 cubic mm non calcified plaque, 0 cubic mm low attenuation plaque, 30 cubic mm total plaque burden.   Left anterior descending artery: 14 Cubic mm calcified plaque, 54 cubic mm non calcified plaque, 2 cubic mm low attenuation plaque, 68 cubic mm total plaque burden.   Right coronary  artery: 18 Cubic mm calcified plaque, 125 cubic mm non calcified plaque, 1 cubic mm low attenuation plaque, 143 cubic mm total plaque burden.   Total: 241 cubic mm total plaque burden.   RCA is a large dominant artery that gives rise to PDA and PLA. Moderate mixed plaque stenosis (50-70%) in the mid RCA at the marginal artery take off. There is low attenuation plaque.   Left main is a large artery that gives rise to LAD and LCX arteries.  There is no significant plaque.   LAD is a large vessel that gives rise to two diagonal vessels. Minimal non-obstructive calcified plaque (1-24%) in the proximal LAD. Minimal non-obstructive calcified plaques (1-24%) in the mid LAD.   LCX is a non-dominant artery that gives rise to one large OM1 branch. There is no significant plaque.   Other non-coronary findings:   Aorta: Normal size.  No calcifications.  No dissection.   Main Pulmonary Artery: Normal size of the pulmonary artery.   Systemic Veins: Normal drainage   Normal pulmonary vein drainage into the left atrium.   Normal left atrial appendage without a thrombus.   Interatrial septum suggestive of a small PFO.   Chamber dimensions: Grossly normal.   Aortic Valve:  Tri-leaflet.   Mitral valve: No calcifications.   Pericardium: Normal thickness   Extra-cardiac findings: See attached radiology report for non-cardiac structures.   Artifact: Mild slab artifact   Image quality: Good   IMPRESSION: 1. Coronary calcium score of 217. This was 73rd percentile for age, sex, and race matched control. Total plaque volume 241 cubic mm. This is 36th percentile for age and sex matched control.   2. Normal coronary origin with right dominance.   3. CAD-RADS 3 moderate stenosis with low attenuation plaque (high-risk plaque modifier). Consider symptom-guided anti-ischemic pharmacotherapy as well as risk factor modification per guideline directed care. Additional analysis with CT FFR  will be submitted.   RECOMMENDATIONS: RECOMMENDATIONS The proposed cut-off value of 1,651 AU yielded a 93 % sensitivity and 75 % specificity in grading AS severity in patients with classical low-flow, low-gradient AS. Proposed different cut-off values to define severe AS for men and women as 2,065 AU and 1,274 AU, respectively. The joint European and American recommendations for the assessment of AS consider the aortic valve calcium score as a continuum - a very high calcium score suggests severe AS and a low calcium score suggests severe AS is unlikely.   Florette Hurry, et al. 2017 ESC/EACTS Guidelines for the management of valvular heart disease. Eur Heart J 2017;38:2739-91.   Coronary artery calcium (CAC) score is a strong predictor of incident coronary heart disease (CHD) and provides predictive information beyond traditional risk factors. CAC scoring is reasonable to use in the decision to withhold, postpone, or initiate statin therapy in intermediate-risk or selected borderline-risk asymptomatic adults (age 56-75 years and LDL-C >=70 to <190 mg/dL) who do not have diabetes or established atherosclerotic cardiovascular disease (ASCVD).* In intermediate-risk (10-year ASCVD risk >=7.5% to <20%) adults or selected borderline-risk (10-year ASCVD risk >=5% to <7.5%) adults in whom a CAC score is measured for the purpose of making a treatment decision the following recommendations have been made:   If CAC = 0, it is reasonable to withhold statin therapy and reassess in 5 to 10 years, as long as higher risk conditions are absent (diabetes mellitus, family history of premature CHD in first degree relatives (males <55 years; females <65 years), cigarette smoking, LDL >=190 mg/dL or other independent risk factors).   If CAC is 1 to 99, it is reasonable to initiate statin therapy for patients >=7 years of age.   If CAC is >=100 or >=75th percentile, it is reasonable  to initiate statin therapy at any age.   Cardiology referral should be considered for patients with CAC scores =400 or >=75th percentile.   *2018 AHA/ACC/AACVPR/AAPA/ABC/ACPM/ADA/AGS/APhA/ASPC/NLA/PCNA Guideline on the Management of Blood Cholesterol: A Report of the American College of Cardiology/American Heart Association Task  Force on Clinical Practice Guidelines. J Am Coll Cardiol. 2019;73(24):3168-3209.   Gloriann Larger, MD   Electronically Signed: By: Gloriann Larger M.D. On: 09/06/2023 14:30  MPRESSION: 1. CT FFR analysis shows no evidence of significant functional stenosis.   Recent Labs: 08/22/2023: BUN 20; Creatinine, Ser 1.40; Potassium 4.8; Sodium 142  Recent Lipid Panel No results found for: "CHOL", "TRIG", "HDL", "CHOLHDL", "VLDL", "LDLCALC", "LDLDIRECT" Dated 05/24/23: cholesterol 260, triglycerides 125, HDL 69, LDL 169, CBC, ALT, potassium normal.   Risk Assessment/Calculations:        Physical Exam:    VS:  There were no vitals taken for this visit.    Wt Readings from Last 3 Encounters:  08/22/23 190 lb (86.2 kg)  06/09/20 188 lb 6.4 oz (85.5 kg)  11/24/17 177 lb 1.6 oz (80.3 kg)     GEN:  Well nourished, well developed in no acute distress HEENT: Normal NECK: No JVD; No carotid bruits LYMPHATICS: No lymphadenopathy CARDIAC: RRR, no murmurs, rubs, gallops RESPIRATORY:  Clear to auscultation without rales, wheezing or rhonchi  ABDOMEN: Soft, non-tender, non-distended MUSCULOSKELETAL:  No edema; No deformity  SKIN: Warm and dry NEUROLOGIC:  Alert and oriented x 3 PSYCHIATRIC:  Normal affect   ASSESSMENT:    No diagnosis found.  PLAN:    In order of problems listed above:  CAD with high coronary calcium score. He does have some concerning symptoms with acute dyspnea at rest and atypical chest discomfort. Recommend coronary CTA to further assess risk. Will start ASA 81 mg daily.  HTN. Not at optimal control with goal < 130/80.  Will increase lisinopril  to 5 mg bid Hypercholesterolemia. Goal LDL< 55. No history of hypertriglyceridemia so will stop fenofibrate. He is statin intolerant so will refer to Pharm D to consider Repatha .  Palpitations. Request a copy of his event monitor to review. Consider investing in [employer] device to document rhythm.            Medication Adjustments/Labs and Tests Ordered: Current medicines are reviewed at length with the patient today.  Concerns regarding medicines are outlined above.  No orders of the defined types were placed in this encounter.  No orders of the defined types were placed in this encounter.   There are no Patient Instructions on file for this visit.   Signed, Breckan Cafiero [place], MD  12/08/2023 1:45 PM    [facility]

## 2023-12-12 ENCOUNTER — Ambulatory Visit: Payer: BC Managed Care – PPO | Admitting: Cardiology
